# Patient Record
Sex: Male | Born: 1985 | Race: Black or African American | Hispanic: No | Marital: Single | Smoking: Never smoker
Health system: Southern US, Community
[De-identification: ages and names within clinical notes are randomized; demographics above are authoritative.]

## PROBLEM LIST (undated history)

## (undated) DIAGNOSIS — J45909 Unspecified asthma, uncomplicated: Secondary | ICD-10-CM

## (undated) DIAGNOSIS — G510 Bell's palsy: Secondary | ICD-10-CM

## (undated) DIAGNOSIS — T7840XA Allergy, unspecified, initial encounter: Secondary | ICD-10-CM

## (undated) DIAGNOSIS — E669 Obesity, unspecified: Secondary | ICD-10-CM

## (undated) DIAGNOSIS — R519 Headache, unspecified: Secondary | ICD-10-CM

## (undated) HISTORY — DX: Headache, unspecified: R51.9

## (undated) HISTORY — PX: TONSILLECTOMY: SUR1361

## (undated) HISTORY — DX: Bell's palsy: G51.0

## (undated) HISTORY — DX: Allergy, unspecified, initial encounter: T78.40XA

---
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
## 2005-11-05 HISTORY — PX: ANAL FISSURE REPAIR: SHX2312

## 2006-09-23 ENCOUNTER — Emergency Department (HOSPITAL_COMMUNITY): Admission: EM | Admit: 2006-09-23 | Discharge: 2006-09-23 | Payer: Self-pay | Admitting: Emergency Medicine

## 2006-09-26 ENCOUNTER — Emergency Department (HOSPITAL_COMMUNITY): Admission: EM | Admit: 2006-09-26 | Discharge: 2006-09-27 | Payer: Self-pay | Admitting: Emergency Medicine

## 2006-10-04 ENCOUNTER — Encounter: Admission: RE | Admit: 2006-10-04 | Discharge: 2006-10-04 | Payer: Self-pay | Admitting: General Surgery

## 2006-10-08 ENCOUNTER — Ambulatory Visit (HOSPITAL_COMMUNITY): Admission: RE | Admit: 2006-10-08 | Discharge: 2006-10-08 | Payer: Self-pay | Admitting: General Surgery

## 2006-12-28 ENCOUNTER — Emergency Department (HOSPITAL_COMMUNITY): Admission: EM | Admit: 2006-12-28 | Discharge: 2006-12-28 | Payer: Self-pay | Admitting: Emergency Medicine

## 2007-02-28 ENCOUNTER — Emergency Department (HOSPITAL_COMMUNITY): Admission: EM | Admit: 2007-02-28 | Discharge: 2007-02-28 | Payer: Self-pay | Admitting: Family Medicine

## 2007-05-04 ENCOUNTER — Emergency Department (HOSPITAL_COMMUNITY): Admission: EM | Admit: 2007-05-04 | Discharge: 2007-05-05 | Payer: Self-pay | Admitting: Emergency Medicine

## 2007-11-21 ENCOUNTER — Inpatient Hospital Stay (HOSPITAL_COMMUNITY): Admission: EM | Admit: 2007-11-21 | Discharge: 2007-11-23 | Payer: Self-pay | Admitting: Emergency Medicine

## 2007-11-26 ENCOUNTER — Ambulatory Visit: Payer: Self-pay | Admitting: Gastroenterology

## 2008-01-20 ENCOUNTER — Emergency Department (HOSPITAL_COMMUNITY): Admission: EM | Admit: 2008-01-20 | Discharge: 2008-01-20 | Payer: Self-pay | Admitting: Emergency Medicine

## 2008-11-09 ENCOUNTER — Emergency Department (HOSPITAL_COMMUNITY): Admission: EM | Admit: 2008-11-09 | Discharge: 2008-11-10 | Payer: Self-pay | Admitting: Emergency Medicine

## 2009-02-28 ENCOUNTER — Emergency Department (HOSPITAL_COMMUNITY): Admission: EM | Admit: 2009-02-28 | Discharge: 2009-02-28 | Payer: Self-pay | Admitting: Family Medicine

## 2009-05-12 ENCOUNTER — Emergency Department (HOSPITAL_COMMUNITY): Admission: EM | Admit: 2009-05-12 | Discharge: 2009-05-12 | Payer: Self-pay | Admitting: Family Medicine

## 2010-11-27 ENCOUNTER — Encounter: Payer: Self-pay | Admitting: General Surgery

## 2011-02-14 LAB — POCT URINALYSIS DIP (DEVICE)
Bilirubin Urine: NEGATIVE
Glucose, UA: NEGATIVE mg/dL
Ketones, ur: NEGATIVE mg/dL
Nitrite: NEGATIVE
pH: 5.5 (ref 5.0–8.0)

## 2011-02-14 LAB — CBC
MCHC: 33.7 g/dL (ref 30.0–36.0)
MCV: 83.6 fL (ref 78.0–100.0)
Platelets: 318 10*3/uL (ref 150–400)
RBC: 4.9 MIL/uL (ref 4.22–5.81)
WBC: 10 10*3/uL (ref 4.0–10.5)

## 2011-02-14 LAB — DIFFERENTIAL
Basophils Relative: 1 % (ref 0–1)
Eosinophils Absolute: 0.2 10*3/uL (ref 0.0–0.7)
Lymphs Abs: 4.8 10*3/uL — ABNORMAL HIGH (ref 0.7–4.0)
Monocytes Relative: 6 % (ref 3–12)
Neutro Abs: 4.3 10*3/uL (ref 1.7–7.7)
Neutrophils Relative %: 43 % (ref 43–77)

## 2011-02-14 LAB — POCT I-STAT, CHEM 8
BUN: 8 mg/dL (ref 6–23)
Chloride: 106 mEq/L (ref 96–112)
HCT: 43 % (ref 39.0–52.0)
Potassium: 4 mEq/L (ref 3.5–5.1)

## 2011-02-19 LAB — CBC
HCT: 43.5 % (ref 39.0–52.0)
Hemoglobin: 14.5 g/dL (ref 13.0–17.0)
MCV: 84 fL (ref 78.0–100.0)
Platelets: 256 10*3/uL (ref 150–400)
RBC: 5.18 MIL/uL (ref 4.22–5.81)
WBC: 9.3 10*3/uL (ref 4.0–10.5)

## 2011-02-19 LAB — DIFFERENTIAL
Eosinophils Absolute: 0.1 10*3/uL (ref 0.0–0.7)
Eosinophils Relative: 1 % (ref 0–5)
Lymphocytes Relative: 21 % (ref 12–46)
Lymphs Abs: 1.9 10*3/uL (ref 0.7–4.0)
Monocytes Absolute: 0.6 10*3/uL (ref 0.1–1.0)
Monocytes Relative: 6 % (ref 3–12)

## 2011-02-19 LAB — POCT I-STAT, CHEM 8
Glucose, Bld: 294 mg/dL — ABNORMAL HIGH (ref 70–99)
HCT: 46 % (ref 39.0–52.0)
Hemoglobin: 15.6 g/dL (ref 13.0–17.0)
Potassium: 3.7 mEq/L (ref 3.5–5.1)

## 2011-03-20 NOTE — H&P (Signed)
Bruce Henry, Bruce NO.:  [id number]   MEDICAL RECORD NO.:  [id number]          PATIENT TYPE:  INP   LOCATION:  5125                         FACILITY:  [facility]   PHYSICIAN:  Della Goo, M.D. DATE OF BIRTH:  [birth year]   DATE OF ADMISSION:  11/21/2007  DATE OF DISCHARGE:                              HISTORY & PHYSICAL   This is an unassigned patient.   CHIEF COMPLAINT:  Abdominal pain.   HISTORY OF PRESENT ILLNESS:  A 25 year old male presenting to the  emergency department with complaints of severe nausea, vomiting,  abdominal pain that started this afternoon.  He reports passing black  tarry stools and also vomiting up coffee-ground emesis.  He denies  having any previous similar episodes to this.  He also denies using any  NSAIDs or aspirin therapies..  He denies having any fevers, chills,  shortness of breath or weakness.   PAST MEDICAL HISTORY:  None.   PAST SURGICAL HISTORY:  1. History of an anal fistula incision and drainage.  2. History of left shoulder surgery after a football injury.  3. History of a tonsillectomy and adenoidectomy.   MEDICATIONS:  None.   ALLERGIES:  No known drug allergies.   SOCIAL HISTORY:  Nonsmoker, nondrinker.   FAMILY HISTORY:  Noncontributory.   PHYSICAL EXAMINATION FINDINGS:  This is a morbidly obese 25 year old  male in discomfort but in no acute distress.  His vital signs are  stable.  He is afebrile.  HEENT: Normocephalic, atraumatic.  Pupils equally round and reactive to  light.  Extraocular muscles are intact, funduscopic benign, oropharynx  is clear.  NECK:  Supple, full range of motion.  No thyromegaly, adenopathy,  jugular venous distention.  CARDIOVASCULAR:  Regular rate and rhythm.  No murmurs, gallops or rubs.  LUNGS:  Clear to auscultation bilaterally.  ABDOMEN:  Positive bowel sounds, soft, nontender, nondistended.  EXTREMITIES:  Without cyanosis, clubbing or edema.  NEUROLOGIC EXAMINATION:   Alert and oriented x3.  There are no focal  deficits.   LABORATORY STUDIES:  Hemoglobin level found to be 13.6 on admission.   ASSESSMENT:  A 25 year old male being admitted with:  1. Acute gastrointestinal bleeding.  2. Epigastric abdominal pain.  3. Morbid obesity.   PLAN:  The patient will be admitted and monitored for continued blood  loss.  H&H checks will be performed q.8 h x48 hours.  The patient will  be transfused if needed.  The patient will be placed on IV Protonix  q.12h and a gastroenterology consultation will be placed.  DVT  prophylaxis of TED hose has been ordered.      Della Goo, M.D.  Electronically Signed     HJ/MEDQ  D:  11/22/2007  T:  11/22/2007  Job:  [id number]

## 2011-03-20 NOTE — Discharge Summary (Signed)
Bruce Henry, Bruce Henry NO.:  [id number]   MEDICAL RECORD NO.:  [id number]          PATIENT TYPE:  INP   LOCATION:  5159                         FACILITY:  [facility]   PHYSICIAN:  Marcellus Scott, MD     DATE OF BIRTH:  1986-07-30   DATE OF ADMISSION:  11/21/2007  DATE OF DISCHARGE:  11/23/2007                               DISCHARGE SUMMARY   PRIMARY CARE PHYSICIAN:  Unassigned.   DISCHARGE DIAGNOSES:  1. Upper gastrointestinal bleed, undetermined source.  2. Mild acute blood loss anemia.  3. Morbid obesity.  4. Abdominal pain.   DISCHARGE MEDICATIONS:  1. Protonix 40 mg p.o. daily.   PROCEDURE:  1. EGD by Dr. Arlyce Dice. Impression is normal exam.   LABORATORY DATA:  Hemoglobin and hematocrit 12.1 and 36.8. Basic  metabolic panel normal with BUN 6, creatinine 0.8. Fecal occult blood  testing positive. Hemoglobin on admission was 13.2. White blood cells  11.5.   CONSULTATIONS:  Melvia Heaps, M.D., GI.   HOSPITAL COURSE/DISPOSITION:  For details of initial admission, please  refer to the history and physical note. In summary, Bruce Henry is a  pleasant 25 year old African-American male patient with no past medical  history and no history of significant alcohol intake or NSAIDS, who  presented with upper abdominal pain, nausea, and vomiting of coffee  ground emesis and melena. Further evaluation in the emergency room found  the patient to be hemodynamically stable and a hemoglobin of 13.6. He  was admitted to the medical floor for further evaluation and management.  The patient was placed on clear liquid diet, IV fluids, and IV Protonix,  and Phenergan. Gastroenterology was consulted and proceeded to do EGD  with findings as indicated above. The patient also indicates that he  works with the [employer] and [employer] as a part  of his daily activity. He has complained of some intermittent upper  abdominal pain for months. In any event, the GI  bleed will not explain  his abdominal pain. The patient's diet was advanced, which he has  tolerated. His abdominal pain has resolved now and so has his GI bleed.  The patient has passed normal colored stools. Gastroenterology has  followed him and indicates that he could have Dieulafoy's ulcer,however,  at this time the patient is stable to be discharged and with no further  GI workup unless he has frank bleeding in which case, the patient has  been instructed to seek immediate medical  attention. The patient also has been advised to diet, exercise, and lose  weight. He will have to have workup with fasting lipids as an  outpatient. He indicates that he does not have a primary medical doctor  and wants assistance in finding one. We will provide him with a  physician's contact.      Marcellus Scott, MD  Electronically Signed     AH/MEDQ  D:  11/23/2007  T:  11/23/2007  Job:  [id number]   cc:   Della Goo, M.D.  Barbette Hair. Arlyce Dice, MD,FACG
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
## 2011-07-26 LAB — CBC
Hemoglobin: 13.2
Platelets: 341
RDW: 13.9

## 2011-07-26 LAB — DIFFERENTIAL
Basophils Absolute: 0.1
Lymphocytes Relative: 48 — ABNORMAL HIGH
Monocytes Absolute: 0.9
Neutro Abs: 4.8

## 2011-07-26 LAB — BASIC METABOLIC PANEL
CO2: 26
GFR calc non Af Amer: >60
Glucose, Bld: 88
Potassium: 3.8
Sodium: 139

## 2011-07-26 LAB — I-STAT 8, (EC8 V) (CONVERTED LAB)
Bicarbonate: 24.2 — ABNORMAL HIGH
HCT: 45
TCO2: 25
pCO2, Ven: 37.1 — ABNORMAL LOW
pH, Ven: 7.421 — ABNORMAL HIGH

## 2011-07-26 LAB — TYPE AND SCREEN
ABO/RH(D): O POS
Antibody Screen: NEGATIVE

## 2011-07-26 LAB — ABO/RH: ABO/RH(D): O POS

## 2011-07-26 LAB — HEMOGLOBIN AND HEMATOCRIT, BLOOD: HCT: 36.8 — ABNORMAL LOW

## 2011-07-30 LAB — COMPREHENSIVE METABOLIC PANEL
AST: 46 — ABNORMAL HIGH
Alkaline Phosphatase: 57
CO2: 25
Chloride: 102
Creatinine, Ser: 0.77
GFR calc Af Amer: >60
GFR calc non Af Amer: >60
Potassium: 3.6
Total Bilirubin: 0.4

## 2011-07-30 LAB — DIFFERENTIAL
Eosinophils Relative: 3
Lymphocytes Relative: 46
Lymphs Abs: 5.9 — ABNORMAL HIGH

## 2011-07-30 LAB — CBC
HCT: 40.4
MCV: 83.3
RBC: 4.86
WBC: 12.9 — ABNORMAL HIGH

## 2011-07-30 LAB — LIPASE, BLOOD: Lipase: 20

## 2012-11-05 DIAGNOSIS — G510 Bell's palsy: Secondary | ICD-10-CM

## 2012-11-05 HISTORY — DX: Bell's palsy: G51.0

## 2013-02-19 ENCOUNTER — Encounter (HOSPITAL_COMMUNITY): Payer: Self-pay | Admitting: Emergency Medicine

## 2013-02-19 ENCOUNTER — Emergency Department (HOSPITAL_COMMUNITY): Payer: No Typology Code available for payment source

## 2013-02-19 ENCOUNTER — Emergency Department (HOSPITAL_COMMUNITY)
Admission: EM | Admit: 2013-02-19 | Discharge: 2013-02-20 | Disposition: A | Discharge status: Home / Self Care | Payer: No Typology Code available for payment source | Attending: Emergency Medicine | Admitting: Emergency Medicine

## 2013-02-19 DIAGNOSIS — S0180XA Unspecified open wound of other part of head, initial encounter: Secondary | ICD-10-CM | POA: Insufficient documentation

## 2013-02-19 DIAGNOSIS — J45909 Unspecified asthma, uncomplicated: Secondary | ICD-10-CM | POA: Insufficient documentation

## 2013-02-19 DIAGNOSIS — S0990XA Unspecified injury of head, initial encounter: Secondary | ICD-10-CM | POA: Insufficient documentation

## 2013-02-19 DIAGNOSIS — Y9289 Other specified places as the place of occurrence of the external cause: Secondary | ICD-10-CM | POA: Insufficient documentation

## 2013-02-19 DIAGNOSIS — IMO0002 Reserved for concepts with insufficient information to code with codable children: Secondary | ICD-10-CM

## 2013-02-19 DIAGNOSIS — Y9389 Activity, other specified: Secondary | ICD-10-CM | POA: Insufficient documentation

## 2013-02-19 DIAGNOSIS — E669 Obesity, unspecified: Secondary | ICD-10-CM | POA: Insufficient documentation

## 2013-02-19 DIAGNOSIS — Z23 Encounter for immunization: Secondary | ICD-10-CM | POA: Insufficient documentation

## 2013-02-19 HISTORY — DX: Unspecified asthma, uncomplicated: J45.909

## 2013-02-19 HISTORY — DX: Obesity, unspecified: E66.9

## 2013-02-19 NOTE — ED Notes (Signed)
CMS intact distal to L leg injury.
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
## 2013-02-20 ENCOUNTER — Emergency Department (HOSPITAL_COMMUNITY): Payer: No Typology Code available for payment source

## 2013-02-20 MED ORDER — HYDROCODONE-ACETAMINOPHEN 5-325 MG PO TABS
2.0000 | ORAL_TABLET | Freq: Once | ORAL | Status: AC
  Administered 2013-02-20: 2 via ORAL
  Filled 2013-02-20: qty 2

## 2013-02-20 MED ORDER — TETANUS-DIPHTH-ACELL PERTUSSIS 5-2.5-18.5 LF-MCG/0.5 IM SUSP
0.5000 mL | Freq: Once | INTRAMUSCULAR | Status: AC
  Administered 2013-02-20: 0.5 mL via INTRAMUSCULAR
  Filled 2013-02-20: qty 0.5

## 2013-02-20 MED ORDER — IBUPROFEN 600 MG PO TABS: 600.0000 mg | ORAL_TABLET | Freq: Four times a day (QID) | ORAL | Status: DC | PRN
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
## 2013-02-20 NOTE — ED Notes (Signed)
EDP Nanavati at bedside suturing wound.

## 2013-04-29 ENCOUNTER — Emergency Department (HOSPITAL_COMMUNITY)
Admission: EM | Admit: 2013-04-29 | Discharge: 2013-04-29 | Disposition: A | Discharge status: Home / Self Care | Payer: Managed Care, Other (non HMO) | Attending: Emergency Medicine | Admitting: Emergency Medicine

## 2013-04-29 ENCOUNTER — Encounter (HOSPITAL_COMMUNITY): Payer: Self-pay | Admitting: Emergency Medicine

## 2013-04-29 DIAGNOSIS — N23 Unspecified renal colic: Secondary | ICD-10-CM | POA: Insufficient documentation

## 2013-04-29 DIAGNOSIS — H9202 Otalgia, left ear: Secondary | ICD-10-CM

## 2013-04-29 DIAGNOSIS — R5383 Other fatigue: Secondary | ICD-10-CM | POA: Insufficient documentation

## 2013-04-29 DIAGNOSIS — R209 Unspecified disturbances of skin sensation: Secondary | ICD-10-CM | POA: Insufficient documentation

## 2013-04-29 DIAGNOSIS — J45909 Unspecified asthma, uncomplicated: Secondary | ICD-10-CM | POA: Insufficient documentation

## 2013-04-29 DIAGNOSIS — R29810 Facial weakness: Secondary | ICD-10-CM | POA: Insufficient documentation

## 2013-04-29 DIAGNOSIS — R5381 Other malaise: Secondary | ICD-10-CM | POA: Insufficient documentation

## 2013-04-29 DIAGNOSIS — E669 Obesity, unspecified: Secondary | ICD-10-CM | POA: Insufficient documentation

## 2013-04-29 DIAGNOSIS — G510 Bell's palsy: Secondary | ICD-10-CM | POA: Insufficient documentation

## 2013-04-29 LAB — CBC WITH DIFFERENTIAL/PLATELET
HCT: 39.7 % (ref 39.0–52.0)
Hemoglobin: 13.8 g/dL (ref 13.0–17.0)
Lymphocytes Relative: 54 % — ABNORMAL HIGH (ref 12–46)
MCHC: 34.8 g/dL (ref 30.0–36.0)
Monocytes Absolute: 0.7 10*3/uL (ref 0.1–1.0)
Monocytes Relative: 7 % (ref 3–12)
Neutro Abs: 3.8 10*3/uL (ref 1.7–7.7)
WBC: 10.1 10*3/uL (ref 4.0–10.5)

## 2013-04-29 LAB — BASIC METABOLIC PANEL
BUN: 7 mg/dL (ref 6–23)
CO2: 26 mEq/L (ref 19–32)
Chloride: 98 mEq/L (ref 96–112)
Creatinine, Ser: 0.69 mg/dL (ref 0.50–1.35)

## 2013-04-29 MED ORDER — PREDNISONE 20 MG PO TABS: ORAL_TABLET | ORAL | Status: DC

## 2013-04-29 MED ORDER — ACYCLOVIR 400 MG PO TABS: 400.0000 mg | ORAL_TABLET | Freq: Every day | ORAL | Status: DC

## 2013-04-29 NOTE — ED Notes (Signed)
Pt c/o left ear pain x 5 days with facial droop and eye droop on left side upon waking this am; no other neuro deficits noted

## 2013-04-29 NOTE — ED Provider Notes (Signed)
History    CSN: [id number] Arrival date & time 04/29/13  1311  First MD Initiated Contact with Patient 04/29/13 1627     Chief Complaint  Patient presents with  . Otalgia  . Facial Droop   (Consider location/radiation/quality/duration/timing/severity/associated sxs/prior Treatment) HPI Comments: Patient comes to the ER for evaluation of left ear pain and left facial numbness. Patient reports that he started having pain in the left ureter proximally 5 days ago. No drainage from the ear. No hearing loss. Today he noticed that the left side of his face has been known in slightly drooping. He also has noticed tearing from the left eye he cannot open and close his eye on the left side as well as on the right. There is no headache. Patient denies numbness, tingling, weakness in any extremity.  Patient is a 27 y.o. male presenting with ear pain.  Otalgia Associated symptoms: no hearing loss    Past Medical History  Diagnosis Date  . Obesity   . Asthma    Past Surgical History  Procedure Laterality Date  . Tonsillectomy     History reviewed. No pertinent family history. History  Substance Use Topics  . Smoking status: Never Smoker   . Smokeless tobacco: Not on file  . Alcohol Use: Yes    Review of Systems  HENT: Positive for ear pain. Negative for hearing loss.   Neurological: Positive for weakness and numbness.  All other systems reviewed and are negative.    Allergies  Review of patient's allergies indicates no known allergies.  Home Medications  No current outpatient prescriptions on file. BP 133/77  Pulse 77  Temp(Src) 98.2 F (36.8 C) (Oral)  Resp 16  SpO2 99% Physical Exam  Constitutional: He is oriented to person, place, and time. He appears well-developed and well-nourished. No distress.  HENT:  Head: Normocephalic and atraumatic.  Right Ear: Hearing, tympanic membrane, external ear and ear canal normal.  Left Ear: Hearing, tympanic membrane, external ear  and ear canal normal.  Nose: Nose normal.  Mouth/Throat: Oropharynx is clear and moist and mucous membranes are normal.  Eyes: Conjunctivae and EOM are normal. Pupils are equal, round, and reactive to light.  Neck: Normal range of motion. Neck supple.  Cardiovascular: Regular rhythm, S1 normal and S2 normal.  Exam reveals no gallop and no friction rub.   No murmur heard. Pulmonary/Chest: Effort normal and breath sounds normal. No respiratory distress. He exhibits no tenderness.  Abdominal: Soft. Normal appearance and bowel sounds are normal. There is no hepatosplenomegaly. There is no tenderness. There is no rebound, no guarding, no tenderness at McBurney's point and negative Murphy's sign. No hernia.  Musculoskeletal: Normal range of motion.  Neurological: He is alert and oriented to person, place, and time. He has normal strength. A cranial nerve deficit is present. No sensory deficit. Coordination normal. GCS eye subscore is 4. GCS verbal subscore is 5. GCS motor subscore is 6.  Left facial nerve sensory and motor deficit - includes forehead and left eyelids  Skin: Skin is warm, dry and intact. No rash noted. No cyanosis.  Psychiatric: He has a normal mood and affect. His speech is normal and behavior is normal. Thought content normal.    ED Course  Procedures (including critical care time) Labs Reviewed  CBC WITH DIFFERENTIAL - Abnormal; Notable for the following:    Neutrophils Relative % 38 (*)    Lymphocytes Relative 54 (*)    Lymphs Abs 5.5 (*)    All  other components within normal limits  BASIC METABOLIC PANEL - Abnormal; Notable for the following:    Glucose, Bld 240 (*)    All other components within normal limits   No results found. Diagnosis: 1. Left Bell's palsy 2. Left otalgia  MDM  Patient presents to ER with 5 days of left ear pain. Examination of the left external canal and tympanic membrane is normal.  Patient also complains of left facial droop. Examination  reveals left facial nerve deficit consistent with Bell's palsy. Upper motor neuron is involved, patient is having difficulty closing his eye causing the tearing. He does not warrant any imaging. He'll be treated empirically. He did have left ear pain, but there are no vesicles or other abnormalities in the left external canal or left tympanic membrane. Will empirically prescribe acyclovir. Patient denies any recent tick bites or rash elsewhere.  Gilda Crease, MD 04/29/13 [phone]

## 2013-05-05 ENCOUNTER — Telehealth: Payer: Self-pay | Admitting: Neurology

## 2013-05-22 ENCOUNTER — Encounter: Payer: Self-pay | Admitting: Neurology

## 2013-05-22 ENCOUNTER — Ambulatory Visit: Payer: Managed Care, Other (non HMO) | Admitting: Neurology

## 2013-05-22 ENCOUNTER — Ambulatory Visit (INDEPENDENT_AMBULATORY_CARE_PROVIDER_SITE_OTHER): Payer: Managed Care, Other (non HMO) | Admitting: Neurology

## 2013-05-22 VITALS — BP 110/70 | HR 78 | Temp 98.1°F | Ht 65.5 in | Wt 307.0 lb

## 2013-05-22 DIAGNOSIS — G510 Bell's palsy: Secondary | ICD-10-CM

## 2013-05-22 NOTE — Patient Instructions (Signed)
You likely have Bell's Palsy (inflammation of the facial nerve).  It will simply take time to further heal.  In the meantime, consider using eye drops like lacrilube in your eye and cover with a eye patch at night to prevent scratching of your cornea.  If you experience eye pain, see an eye doctor.

## 2013-05-22 NOTE — Progress Notes (Signed)
NEUROLOGY CONSULTATION NOTE  Bruce Henry MRN: [id number] DOB: [birth year]   Referring provider: Emergency Department Primary care provider: None  Reason for consult:  Bell's Palsy  HISTORY OF PRESENT ILLNESS: Bruce Henry is a 27 y.o. male who presents for the evaluation of ear pain and left-sided facial weakness. He is accompanied by his wife. On 04/29/13, patient presented to the emergency department with left ear pain and left facial weakness. Approximately 3 weeks prior, he developed severe ear pain in and in back of his ear. The day prior to his ER visit, he noticed that his left eye was droopy and that his mouth was crooked. He did not have any associated headache. He did not have any numbness or hearing loss or tinnitus. He denies any dysfunction of taste. He denies any visual dysfunction. He does note tearing of the left eye. He was treated empirically with acyclovir and prednisone. He continues to have your pain which is intermittent. He describes hyperacusis in the left ear. He does use eye drops but doesn't wear a patch over his eye when he sleeps. He notes perhaps mild improvement in his left-sided facial weakness.  PAST MEDICAL HISTORY: Past Medical History  Diagnosis Date  . Obesity   . Asthma   . Bell's palsy 2014    PAST SURGICAL HISTORY: Past Surgical History  Procedure Laterality Date  . Tonsillectomy    . Shoulder surgery  2003    left  . Anal fissure repair  2007    MEDICATIONS: Current Outpatient Prescriptions on File Prior to Visit  Medication Sig Dispense Refill  . acyclovir (ZOVIRAX) 400 MG tablet Take 1 tablet (400 mg total) by mouth 5 (five) times daily.  50 tablet  0  . predniSONE (DELTASONE) 20 MG tablet 3 tabs po daily for 5 days, then 2.5 tabs for one day, then 2 tabs for one day, then 1.5 tabs for one day, then 1 tab for one day, then 0.5 tab for one day then stop.  23 tablet  0   No current facility-administered medications on file prior to  visit.    ALLERGIES: No Known Allergies  FAMILY HISTORY: No family history on file.  SOCIAL HISTORY: History   Social History  . Marital Status: Single    Spouse Name: N/A    Number of Children: N/A  . Years of Education: N/A   Occupational History  . Not on file.   Social History Main Topics  . Smoking status: Never Smoker   . Smokeless tobacco: Former Neurosurgeon  . Alcohol Use: Yes     Comment: occasionally  . Drug Use: No  . Sexually Active: Not on file   Other Topics Concern  . Not on file   Social History Narrative  . No narrative on file    REVIEW OF SYSTEMS: Constitutional: No fevers, chills, or sweats, no generalized fatigue, change in appetite Eyes: No visual changes, double vision, eye pain Ear, nose and throat: No hearing loss, ear pain, nasal congestion, sore throat Cardiovascular: No chest pain, palpitations Respiratory:  No shortness of breath at rest or with exertion, wheezes GastrointestinaI: No nausea, vomiting, diarrhea, abdominal pain, fecal incontinence Genitourinary:  No dysuria, urinary retention or frequency Musculoskeletal:  No neck pain, back pain Integumentary: No rash, pruritus, skin lesions Neurological: as above Psychiatric: No depression, insomnia, anxiety Endocrine: No palpitations, fatigue, diaphoresis, mood swings, change in appetite, change in weight, increased thirst Hematologic/Lymphatic:  No anemia, purpura, petechiae. Allergic/Immunologic: no itchy/runny eyes, nasal  congestion, recent allergic reactions, rashes  PHYSICAL EXAM: Filed Vitals:   05/22/13 1537  BP: 110/70  Pulse: 78  Temp: 98.1 F (36.7 C)   General: No acute distress Head:  Normocephalic/atraumatic Neck: supple, no paraspinal tenderness, full range of motion Back: No paraspinal tenderness Heart: regular rate and rhythm Lungs: Clear to auscultation bilaterally. Vascular: No carotid bruits. Neurological Exam: Mental status: alert and oriented to person,  place, time and self, speech fluent and not dysarthric, language intact. Cranial nerves: CN I: not tested CN II: pupils equal, round and reactive to light, visual fields intact, fundi unremarkable. CN III, IV, VI:  full range of motion, no nystagmus, no ptosis CN V: facial sensation intact CN VII: mild upper and lower facial weakness on left CN VIII: hearing intact CN IX, X: gag intact, uvula midline CN XI: sternocleidomastoid and trapezius muscles intact CN XII: tongue midline Bulk & Tone: normal, no fasciculations. Motor:  Sensation: temperature and vibration intact Deep Tendon Reflexes: 2+ throughout, toes down-going Finger to nose testing: normal Gait: normal. Romberg negative.  IMPRESSION & PLAN: Bruce Henry is a 27 y.o. male with Bell's palsy affecting the left facial nerve.  Symptoms seem fairly mild.  Explained that it will simply take time for further resolution of symptoms.  Usually it can take up to 3 months before complete or near-complete resolution of symptoms.  In the meantime, he may consider using Lacri-Lube and eye patch when he sleeps to prevent corneal abrasions.  He should have his eye checked if he develops any burning or pain in his eye.  No further workup warranted.  45 minutes but with the patient, over 50% spent counseling and coordinating care.  Thank you for allowing me to take part in the care of this patient.  Shon Millet, DO

## 2013-09-10 ENCOUNTER — Other Ambulatory Visit: Payer: Self-pay

## 2014-04-20 ENCOUNTER — Encounter (HOSPITAL_COMMUNITY): Payer: Self-pay | Admitting: Emergency Medicine

## 2014-04-20 ENCOUNTER — Emergency Department (INDEPENDENT_AMBULATORY_CARE_PROVIDER_SITE_OTHER)
Admission: EM | Admit: 2014-04-20 | Discharge: 2014-04-20 | Disposition: A | Discharge status: Home / Self Care | Payer: Managed Care, Other (non HMO) | Source: Home / Self Care | Attending: Emergency Medicine | Admitting: Emergency Medicine

## 2014-04-20 DIAGNOSIS — I889 Nonspecific lymphadenitis, unspecified: Secondary | ICD-10-CM

## 2014-04-20 DIAGNOSIS — H6690 Otitis media, unspecified, unspecified ear: Secondary | ICD-10-CM

## 2014-04-20 LAB — POCT RAPID STREP A: STREPTOCOCCUS, GROUP A SCREEN (DIRECT): NEGATIVE

## 2014-04-20 MED ORDER — AMOXICILLIN 500 MG PO CAPS: 500.0000 mg | ORAL_CAPSULE | Freq: Three times a day (TID) | ORAL | Status: DC

## 2014-04-20 NOTE — Discharge Instructions (Signed)
Otitis Media, Adult Otitis media is redness, soreness, and puffiness (swelling) in the space just behind your eardrum (middle ear). It may be caused by allergies or infection. It often happens along with a cold. HOME CARE  Take your medicine as told. Finish it even if you start to feel better.  Only take over-the-counter or prescription medicines for pain, discomfort, or fever as told by your doctor.  Follow up with your doctor as told. GET HELP IF:  You have otitis media only in one ear or bleeding from your nose or both.  You notice a lump on your neck.  You are not getting better in 3 5 days.  You feel worse instead of better. GET HELP RIGHT AWAY IF:   You have pain that is not helped with medicine.  You have puffiness, redness, or pain around your ear.  You get a stiff neck.  You cannot move part of your face (paralysis).  You notice that the bone behind your ear hurts when you touch it. MAKE SURE YOU:   Understand these instructions.  Will watch your condition.  Will get help right away if you are not doing well or get worse. Document Released: 04/09/2008 Document Revised: 06/24/2013 Document Reviewed: 05/19/2013 [facility] Patient Information 2014 [place], [place].  Swollen Lymph Nodes The lymphatic system filters fluid from around cells. It is like a system of blood vessels. These channels carry lymph instead of blood. The lymphatic system is an important part of the immune (disease fighting) system. When people talk about "swollen glands in the neck," they are usually talking about swollen lymph nodes. The lymph nodes are like the little traps for infection. You and your caregiver may be able to feel lymph nodes, especially swollen nodes, in these common areas: the groin (inguinal area), armpits (axilla), and above the clavicle (supraclavicular). You may also feel them in the neck (cervical) and the back of the head just above the hairline (occipital). Swollen glands  occur when there is any condition in which the body responds with an allergic type of reaction. For instance, the glands in the neck can become swollen from insect bites or any type of minor infection on the head. These are very noticeable in children with only minor problems. Lymph nodes may also become swollen when there is a tumor or problem with the lymphatic system, such as Hodgkin's disease. TREATMENT   Most swollen glands do not require treatment. They can be observed (watched) for a short period of time, if your caregiver feels it is necessary. Most of the time, observation is not necessary.  Antibiotics (medicines that kill germs) may be prescribed by your caregiver. Your caregiver may prescribe these if he or she feels the swollen glands are due to a bacterial (germ) infection. Antibiotics are not used if the swollen glands are caused by a virus. HOME CARE INSTRUCTIONS   Take medications as directed by your caregiver. Only take over-the-counter or prescription medicines for pain, discomfort, or fever as directed by your caregiver. SEEK MEDICAL CARE IF:   If you begin to run a temperature greater than 102 F (38.9 C), or as your caregiver suggests. MAKE SURE YOU:   Understand these instructions.  Will watch your condition.  Will get help right away if you are not doing well or get worse. Document Released: 10/12/2002 Document Revised: 01/14/2012 Document Reviewed: 10/22/2005 [facility] Patient Information 2014 [place], [place].   Take Motrin 800mg  every 8 hours until neck and nodes feel better in the  neck. Use Mucinex 1200mg  every 12 hours for ear pain and drainage. Finish all antibiotics. Feel better.

## 2014-04-20 NOTE — ED Notes (Signed)
Pt c/o right side of neck pain onset 4 days Reports he woke up w/the pain Denies f/v/n/d, cold sx, inj/trauma Also c/o bilateral ear pain onset 2 days Alert w/no signs of acute distress.

## 2014-04-20 NOTE — ED Provider Notes (Signed)
CSN: [id number]     Arrival date & time 04/20/14  1849 History   First MD Initiated Contact with Patient 04/20/14 1916     Chief Complaint  Patient presents with  . Neck Pain   (Consider location/radiation/quality/duration/timing/severity/associated sxs/prior Treatment) HPI Comments: Patient presents with a 4 day history of right sided neck pain and "swelling", severe right ear pain as well. No injury. No fevers. No muscle pain. Sore throat at times on the right side. See ROS.   Patient is a 28 y.o. male presenting with neck pain. The history is provided by the patient.  Neck Pain   Past Medical History  Diagnosis Date  . Obesity   . Asthma   . Bell's palsy 2014   Past Surgical History  Procedure Laterality Date  . Tonsillectomy    . Shoulder surgery  2003    left  . Anal fissure repair  2007   No family history on file. History  Substance Use Topics  . Smoking status: Never Smoker   . Smokeless tobacco: Former NeurosurgeonUser  . Alcohol Use: Yes     Comment: occasionally    Review of Systems  Musculoskeletal: Positive for neck pain.  All other systems reviewed and are negative.   Allergies  Review of patient's allergies indicates no known allergies.  Home Medications   Prior to Admission medications   Medication Sig Start Date End Date Taking? Authorizing Provider  aspirin 325 MG tablet Take by mouth every 6 (six) hours as needed for pain.   Yes Historical Provider, MD  acyclovir (ZOVIRAX) 400 MG tablet Take 1 tablet (400 mg total) by mouth 5 (five) times daily. 04/29/13   Gilda Creasehristopher J. Pollina, MD  amoxicillin (AMOXIL) 500 MG capsule Take 1 capsule (500 mg total) by mouth 3 (three) times daily. 04/20/14   Riki SheerMichelle G Young, PA-C  predniSONE (DELTASONE) 20 MG tablet 3 tabs po daily for 5 days, then 2.5 tabs for one day, then 2 tabs for one day, then 1.5 tabs for one day, then 1 tab for one day, then 0.5 tab for one day then stop. 04/29/13   Gilda Creasehristopher J. Pollina, MD   BP  126/82  Pulse 104  Temp(Src) 99.6 F (37.6 C) (Oral)  Resp 18  SpO2 98% Physical Exam  Nursing note and vitals reviewed. Constitutional: He is oriented to person, place, and time. He appears well-developed and well-nourished. No distress.  HENT:  Head: Normocephalic and atraumatic.  Left Ear: External ear normal.  Right TM buldge, erythematous, with retro fluid TM  Eyes: Pupils are equal, round, and reactive to light.  Neck: Normal range of motion. Neck supple.  Right tonsillar nodes, tender to palpation  Cardiovascular: Normal rate, regular rhythm and intact distal pulses.   Pulmonary/Chest: Effort normal and breath sounds normal. No respiratory distress. He has no wheezes.  Lymphadenopathy:    He has cervical adenopathy.  Neurological: He is alert and oriented to person, place, and time.  Skin: Skin is warm and dry. No rash noted. He is not diaphoretic.  Psychiatric: His behavior is normal.    ED Course  Procedures (including critical care time) Labs Review Labs Reviewed  POCT RAPID STREP A ([facility] CARE ONLY)    Imaging Review No results found.   MDM   1. Otitis media   2. Lymphadenitis    Cover with Amoxicillin. Symptomatic care for the pain with NSAIDs and Mucinex as needed. F/U if worsens.     Riki SheerMichelle G Young, PA-C  04/20/14 1956 

## 2014-04-21 NOTE — ED Provider Notes (Signed)
Medical screening examination/treatment/procedure(s) were performed by non-physician practitioner and as supervising physician I was immediately available for consultation/collaboration.  Leslee Homeavid Cartier Washko, M.D.  Reuben Likesavid C Damita Eppard, MD 04/21/14 [phone]

## 2014-04-23 LAB — CULTURE, GROUP A STREP

## 2014-04-23 NOTE — Progress Notes (Signed)
Quick Note:  Results are abnormal as noted, but have been adequately treated. No further action necessary. ______ 

## 2014-04-24 ENCOUNTER — Telehealth (HOSPITAL_COMMUNITY): Payer: Self-pay | Admitting: *Deleted

## 2014-04-24 NOTE — ED Notes (Signed)
Throat culture: Group A strep (S. Pyogenes).  Pt. adequately treated with Amoxicillin.  Needs notified. Vassie MoselleYork, Suzanne M 04/24/2014

## 2014-04-24 NOTE — ED Notes (Signed)
I called pt. Pt. verified x 2 and given results.  Pt. told he was adequately treated with Amoxicillin and instructions given.  Pt. voiced understanding. Vassie MoselleYork, Kalim Kissel M 04/24/2014

## 2014-08-20 ENCOUNTER — Other Ambulatory Visit: Payer: Self-pay

## 2014-12-14 ENCOUNTER — Encounter: Payer: Self-pay | Admitting: *Deleted

## 2015-01-03 ENCOUNTER — Ambulatory Visit (INDEPENDENT_AMBULATORY_CARE_PROVIDER_SITE_OTHER): Payer: 59 | Admitting: Family Medicine

## 2015-01-03 ENCOUNTER — Encounter: Payer: Self-pay | Admitting: Family Medicine

## 2015-01-03 VITALS — BP 126/80 | HR 84 | Temp 99.0°F | Resp 18 | Ht 65.0 in | Wt 292.0 lb

## 2015-01-03 DIAGNOSIS — Z0000 Encounter for general adult medical examination without abnormal findings: Secondary | ICD-10-CM

## 2015-01-03 DIAGNOSIS — R0981 Nasal congestion: Secondary | ICD-10-CM

## 2015-01-03 DIAGNOSIS — H6692 Otitis media, unspecified, left ear: Secondary | ICD-10-CM

## 2015-01-03 DIAGNOSIS — R7301 Impaired fasting glucose: Secondary | ICD-10-CM

## 2015-01-03 LAB — CBC WITH DIFFERENTIAL/PLATELET
BASOS ABS: 0 10*3/uL (ref 0.0–0.1)
BASOS PCT: 0 % (ref 0–1)
EOS ABS: 0.1 10*3/uL (ref 0.0–0.7)
EOS PCT: 1 % (ref 0–5)
HCT: 43.6 % (ref 39.0–52.0)
Hemoglobin: 14.4 g/dL (ref 13.0–17.0)
Lymphocytes Relative: 58 % — ABNORMAL HIGH (ref 12–46)
Lymphs Abs: 6 10*3/uL — ABNORMAL HIGH (ref 0.7–4.0)
MCH: 28.3 pg (ref 26.0–34.0)
MCHC: 33 g/dL (ref 30.0–36.0)
MCV: 85.8 fL (ref 78.0–100.0)
MONO ABS: 0.5 10*3/uL (ref 0.1–1.0)
MONOS PCT: 5 % (ref 3–12)
MPV: 10.2 fL (ref 8.6–12.4)
NEUTROS ABS: 3.7 10*3/uL (ref 1.7–7.7)
Neutrophils Relative %: 36 % — ABNORMAL LOW (ref 43–77)
PLATELETS: 323 10*3/uL (ref 150–400)
RBC: 5.08 MIL/uL (ref 4.22–5.81)
RDW: 13.6 % (ref 11.5–15.5)
WBC: 10.4 10*3/uL (ref 4.0–10.5)

## 2015-01-03 LAB — LIPID PANEL
CHOL/HDL RATIO: 13.3 ratio
Cholesterol: 305 mg/dL — ABNORMAL HIGH (ref 0–200)
HDL: 23 mg/dL — AB (ref >=40)
Triglycerides: 860 mg/dL — ABNORMAL HIGH (ref <150)

## 2015-01-03 LAB — COMPREHENSIVE METABOLIC PANEL
ALT: 51 U/L (ref 0–53)
AST: 31 U/L (ref 0–37)
Albumin: 4 g/dL (ref 3.5–5.2)
Alkaline Phosphatase: 75 U/L (ref 39–117)
BILIRUBIN TOTAL: 0.3 mg/dL (ref 0.2–1.2)
BUN: 10 mg/dL (ref 6–23)
CALCIUM: 9.6 mg/dL (ref 8.4–10.5)
CHLORIDE: 96 meq/L (ref 96–112)
CO2: 21 meq/L (ref 19–32)
CREATININE: 0.81 mg/dL (ref 0.50–1.35)
Glucose, Bld: 308 mg/dL — ABNORMAL HIGH (ref 70–99)
Potassium: 4.2 mEq/L (ref 3.5–5.3)
Sodium: 134 mEq/L — ABNORMAL LOW (ref 135–145)
Total Protein: 7.5 g/dL (ref 6.0–8.3)

## 2015-01-03 LAB — HEMOGLOBIN A1C
Hgb A1c MFr Bld: 12.2 % — ABNORMAL HIGH (ref <5.7)
Mean Plasma Glucose: 303 mg/dL — ABNORMAL HIGH (ref <117)

## 2015-01-03 MED ORDER — CETIRIZINE HCL 10 MG PO TABS: 10.0000 mg | ORAL_TABLET | Freq: Every day | ORAL | Status: DC

## 2015-01-03 MED ORDER — AMOXICILLIN 875 MG PO TABS: 875.0000 mg | ORAL_TABLET | Freq: Two times a day (BID) | ORAL | Status: DC

## 2015-01-03 NOTE — Progress Notes (Signed)
Patient ID: Bruce CoilKevin A Stelmach, male   DOB: 04-05-1986, 29 y.o.   MRN: [id number]   Subjective:    Patient ID: Bruce Henry, male    DOB: 04-05-1986, 29 y.o.   MRN: [id number]  Patient presents for New Patient and Illness  Patient here to establish care. He does not have a primary care provider. 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 He has a strong family history of diabetes mellitus as well as hypertension. He states that he passed all of the other elements on the DOT exam. His medications and history were reviewed. He does have history of childhood asthma that has resolved but he consistently has seasonal allergies. He had a severe ear infection last year which led to a Bell's palsy that has now resolved.  He complains of left ear pain over the past week which has worsened he's also had some nasal congestion and drainage for the past 2 days. He has not had any fever. No over-the-counter medications have been taken   Obesity-he states that his heaviest weight was 320 pounds he is intentionally lost about 30 pounds over the past 4 months to improve his health  Review Of Systems: per above   GEN- denies fatigue, fever, weight loss,weakness, recent illness HEENT- denies eye drainage, change in vision, +nasal discharge, CVS- denies chest pain, palpitations RESP- denies SOB, cough, wheeze ABD- denies N/V, change in stools, abd pain GU- denies dysuria, hematuria, dribbling, incontinence MSK- denies joint pain, muscle aches, injury Neuro- denies headache, dizziness, syncope, seizure activity       Objective:    BP 126/80 mmHg  Pulse 84  Temp(Src) 99 F (37.2 C) (Oral)  Resp 18  Ht 5\' 5"  (1.651 m)  Wt 292 lb (132.45 kg)  BMI 48.59 kg/m2 GEN- NAD, alert and oriented x3, wears glasses HEENT- PERRL, EOMI, non injected sclera, pink conjunctiva, MMM, oropharynx clear, nares clear rhinorrhea, Left TM  injected, dull light reflex, pinna NT, Rigth TM clear, canal clear Neck- Supple, no thyromegaly, no LAD CVS- RRR, no murmur RESP-CTAB ABD-NABS,soft,NT,ND EXT- No edema Pulses- Radial 2+        Assessment & Plan:      Problem List Items Addressed This Visit    None    Visit Diagnoses    Acute left otitis media, recurrence not specified, unspecified otitis media type    -  Primary    Treat with amoxicillin,previous infection led to bells palsy    Relevant Medications    amoxicillin (AMOXIL) tablet    Routine general medical examination at a health care facility        CPE done, TDAP UTD, fasting labs today, will notify his job based on results, discussed need for continued weight loss and healthy eating    Relevant Orders    CBC with Differential/Platelet    Comprehensive metabolic panel    Lipid panel    Nasal congestion        zyrtec as needed, has seasonal allergies    Elevated fasting blood sugar        check A1C, high risk for DM    Relevant Orders    Hemoglobin A1c       Note: This dictation was prepared with Dragon dictation along with smaller phrase technology. Any transcriptional errors that result from this process are unintentional.

## 2015-01-03 NOTE — Patient Instructions (Signed)
I recommend eye visit once a year I recommend dental visit every 6 months Goal is to  Exercise 30 minutes 5 days a week We will call with lab results  F/U pending results

## 2015-01-04 ENCOUNTER — Other Ambulatory Visit: Payer: Self-pay | Admitting: *Deleted

## 2015-01-04 MED ORDER — BLOOD GLUCOSE MONITOR KIT: PACK | Status: DC

## 2015-01-05 ENCOUNTER — Ambulatory Visit (INDEPENDENT_AMBULATORY_CARE_PROVIDER_SITE_OTHER): Payer: 59 | Admitting: Family Medicine

## 2015-01-05 ENCOUNTER — Encounter: Payer: Self-pay | Admitting: Family Medicine

## 2015-01-05 VITALS — BP 126/88 | HR 78 | Temp 98.7°F | Resp 18 | Ht 65.0 in | Wt 292.0 lb

## 2015-01-05 DIAGNOSIS — E785 Hyperlipidemia, unspecified: Secondary | ICD-10-CM | POA: Diagnosis not present

## 2015-01-05 DIAGNOSIS — E119 Type 2 diabetes mellitus without complications: Secondary | ICD-10-CM

## 2015-01-05 MED ORDER — METFORMIN HCL 1000 MG PO TABS: 1000.0000 mg | ORAL_TABLET | Freq: Two times a day (BID) | ORAL | Status: DC

## 2015-01-05 MED ORDER — LISINOPRIL 2.5 MG PO TABS: 2.5000 mg | ORAL_TABLET | Freq: Every day | ORAL | Status: DC

## 2015-01-05 NOTE — Assessment & Plan Note (Signed)
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 No renal impairment is noted Start metformin titrate to 1000mg  BID over next 2 weeks, check CBG BID, discussed dietary changes, d/c soda, eating regular meals, given handouts on nutrition with he does seem to be knowledge of the correct way to eat regarding's carbs/proteins and fats. RTC in 2 weeks, if CBG are down below 200's I will release him to drive. Start low dose ACEI

## 2015-01-05 NOTE — Progress Notes (Signed)
Patient ID: Bruce CoilKevin A Janco, male   DOB: 11-12-1985, 29 y.o.   MRN: [id number]   Subjective:    Patient ID: Bruce CoilKevin A Snedden, male    Patient here to follow-up labsDOB: 11-12-1985, 29 y.o.   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 Lipids also very elevated TG in 800's, LDL could not be calculated.  Denies polyuria, polydipisa. Tends to drink 3 - 20 ounces of sprite a day, otherwise water and he eats 1 large meal around 5pm    Review Of Systems:  GEN- denies fatigue, fever, weight loss,weakness, recent illness HEENT- denies eye drainage, change in vision, nasal discharge, CVS- denies chest pain, palpitations RESP- denies SOB, cough, wheeze ABD- denies N/V, change in stools, abd pain GU- denies dysuria, hematuria, dribbling, incontinence MSK- denies joint pain, muscle aches, injury Neuro- denies headache, dizziness, syncope, seizure activity       Objective:    BP 126/88 mmHg  Pulse 78  Temp(Src) 98.7 F (37.1 C) (Oral)  Resp 18  Ht 5\' 5"  (1.651 m)  Wt 292 lb (132.45 kg)  BMI 48.59 kg/m2 GEN- NAD, alert and oriented x3         Assessment & Plan:      Problem List Items Addressed This Visit    None    Visit Diagnoses    Type 2 diabetes mellitus without complication    -  Primary    Relevant Medications    metFORMIN (GLUCOPHAGE) tablet    lisinopril (PRINIVIL,ZESTRIL) tablet    Hyperlipidemia        Relevant Medications    lisinopril (PRINIVIL,ZESTRIL) tablet       Note: This dictation was prepared with Dragon dictation along with smaller Lobbyistphrase technology. Any transcriptional errors that result from this process are unintentional.

## 2015-01-05 NOTE — Patient Instructions (Addendum)
Take 1/2 tablet with dinner for 3 days, then increase to 1/2 tablet twice a day for 1 week, then 1 tablet twice a day  Check blood sugars twice a day, fasting and after dinner and record Start lisinopril low dose for your kidney protection F/U 2 weeks  Diabetes Mellitus and Food It is important for you to manage your blood sugar (glucose) level. Your blood glucose level can be greatly affected by what you eat. Eating healthier foods in the appropriate amounts throughout the day at about the same time each day will help you control your blood glucose level. It can also help slow or prevent worsening of your diabetes mellitus. Healthy eating may even help you improve the level of your blood pressure and reach or maintain a healthy weight.  HOW CAN FOOD AFFECT ME? Carbohydrates Carbohydrates affect your blood glucose level more than any other type of food. Your dietitian will help you determine how many carbohydrates to eat at each meal and teach you how to count carbohydrates. Counting carbohydrates is important to keep your blood glucose at a healthy level, especially if you are using insulin or taking certain medicines for diabetes mellitus. Alcohol Alcohol can cause sudden decreases in blood glucose (hypoglycemia), especially if you use insulin or take certain medicines for diabetes mellitus. Hypoglycemia can be a life-threatening condition. Symptoms of hypoglycemia (sleepiness, dizziness, and disorientation) are similar to symptoms of having too much alcohol.  If your health care provider has given you approval to drink alcohol, do so in moderation and use the following guidelines:  Women should not have more than one drink per day, and men should not have more than two drinks per day. One drink is equal to:  12 oz of beer.  5 oz of wine.  1 oz of hard liquor.  Do not drink on an empty stomach.  Keep yourself hydrated. Have water, diet soda, or unsweetened iced tea.  Regular soda, juice,  and other mixers might contain a lot of carbohydrates and should be counted. WHAT FOODS ARE NOT RECOMMENDED? As you make food choices, it is important to remember that all foods are not the same. Some foods have fewer nutrients per serving than other foods, even though they might have the same number of calories or carbohydrates. It is difficult to get your body what it needs when you eat foods with fewer nutrients. Examples of foods that you should avoid that are high in calories and carbohydrates but low in nutrients include:  Trans fats (most processed foods list trans fats on the Nutrition Facts label).  Regular soda.  Juice.  Candy.  Sweets, such as cake, pie, doughnuts, and cookies.  Fried foods. WHAT FOODS CAN I EAT? Have nutrient-rich foods, which will nourish your body and keep you healthy. The food you should eat also will depend on several factors, including:  The calories you need.  The medicines you take.  Your weight.  Your blood glucose level.  Your blood pressure level.  Your cholesterol level. You also should eat a variety of foods, including:  Protein, such as meat, poultry, fish, tofu, nuts, and seeds (lean animal proteins are best).  Fruits.  Vegetables.  Dairy products, such as milk, cheese, and yogurt (low fat is best).  Breads, grains, pasta, cereal, rice, and beans.  Fats such as olive oil, trans fat-free margarine, canola oil, avocado, and olives. DOES EVERYONE WITH DIABETES MELLITUS HAVE THE SAME MEAL PLAN? Because every person with diabetes mellitus is different,  there is not one meal plan that works for everyone. It is very important that you meet with a dietitian who will help you create a meal plan that is just right for you. Document Released: 07/19/2005 Document Revised: 10/27/2013 Document Reviewed: 09/18/2013 [facility] Patient Information 2015 [place], [place]. This information is not intended to replace advice given to you by your health  care provider. Make sure you discuss any questions you have with your health care provider.

## 2015-01-05 NOTE — Assessment & Plan Note (Signed)
Will not treat until CBG start to come down, TG reacting to the hyperglycemia He will need statin drug in future

## 2015-01-24 ENCOUNTER — Ambulatory Visit (INDEPENDENT_AMBULATORY_CARE_PROVIDER_SITE_OTHER): Payer: 59 | Admitting: Family Medicine

## 2015-01-24 ENCOUNTER — Encounter: Payer: Self-pay | Admitting: Family Medicine

## 2015-01-24 VITALS — BP 128/68 | HR 72 | Temp 98.8°F | Resp 16 | Ht 65.0 in | Wt 293.0 lb

## 2015-01-24 DIAGNOSIS — E119 Type 2 diabetes mellitus without complications: Secondary | ICD-10-CM | POA: Diagnosis not present

## 2015-01-24 NOTE — Progress Notes (Signed)
Patient ID: Bruce Henry, male   DOB: September 14, 1986, 29 y.o.   MRN: [id number]   Subjective:    Patient ID: Bruce CoilKevin A Henry, male    DOB: September 14, 1986, 29 y.o.   MRN: [id number]  Patient presents for F/U   Pt here to f/u DM, he was diagnosed with Type II DM, 2 weeks ago, A1C was 12.2%. Started MTF, titrated up to 1000mg  BID. Had diarrhea nausea initially now resolved. CBG past 2 weeks have been 107-214, avg 130's, no hypoglycemia, besides 1st week starting meds no CBG > 200.   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 No concerns today    Review Of Systems:  GEN- denies fatigue, fever, weight loss,weakness, recent illness HEENT- denies eye drainage, change in vision, nasal discharge, CVS- denies chest pain, palpitations RESP- denies SOB, cough, wheeze ABD- denies N/V, change in stools, abd pain GU- denies dysuria, hematuria, dribbling, incontinence MSK- denies joint pain, muscle aches, injury Neuro- denies headache, dizziness, syncope, seizure activity       Objective:    BP 128/68 mmHg  Pulse 72  Temp(Src) 98.8 F (37.1 C) (Oral)  Resp 16  Ht 5\' 5"  (1.651 m)  Wt 293 lb (132.904 kg)  BMI 48.76 kg/m2 GEN- NAD, alert and oriented x3        Assessment & Plan:      Problem List Items Addressed This Visit      Unprioritized   Morbid obesity   Diabetes mellitus, type II - Primary      Note: This dictation was prepared with Dragon dictation along with smaller phrase technology. Any transcriptional errors that result from this process are unintentional.

## 2015-01-24 NOTE — Patient Instructions (Signed)
Continue metformin as prescribed Keep watching the diet  Decrease the testing to twice a day  F/U  1st of June

## 2015-01-24 NOTE — Assessment & Plan Note (Signed)
CBG now optimal compared to initial readings, consistently below 200 with MTF, continue, repeat A1C in 8 weeks Released to drive since CBG are down and on oral medications Continue ACEI Continue with low sugar, low fat diet, which he is doing well with

## 2015-04-06 ENCOUNTER — Ambulatory Visit: Payer: 59 | Admitting: Family Medicine

## 2015-05-02 ENCOUNTER — Other Ambulatory Visit: Payer: Self-pay

## 2016-01-09 ENCOUNTER — Emergency Department (INDEPENDENT_AMBULATORY_CARE_PROVIDER_SITE_OTHER)
Admission: EM | Admit: 2016-01-09 | Discharge: 2016-01-09 | Disposition: A | Discharge status: Home / Self Care | Payer: 59 | Source: Home / Self Care | Attending: Family Medicine | Admitting: Family Medicine

## 2016-01-09 ENCOUNTER — Encounter (HOSPITAL_COMMUNITY): Payer: Self-pay | Admitting: *Deleted

## 2016-01-09 DIAGNOSIS — A0811 Acute gastroenteropathy due to Norwalk agent: Secondary | ICD-10-CM

## 2016-01-09 LAB — POCT I-STAT, CHEM 8
BUN: 5 mg/dL — ABNORMAL LOW (ref 6–20)
CALCIUM ION: 1.15 mmol/L (ref 1.12–1.23)
Chloride: 101 mmol/L (ref 101–111)
Creatinine, Ser: 0.6 mg/dL — ABNORMAL LOW (ref 0.61–1.24)
GLUCOSE: 165 mg/dL — AB (ref 65–99)
HCT: 43 % (ref 39.0–52.0)
Hemoglobin: 14.6 g/dL (ref 13.0–17.0)
Potassium: 3.9 mmol/L (ref 3.5–5.1)
SODIUM: 140 mmol/L (ref 135–145)
TCO2: 28 mmol/L (ref 0–100)

## 2016-01-09 MED ORDER — ONDANSETRON HCL 4 MG PO TABS: 4.0000 mg | ORAL_TABLET | Freq: Four times a day (QID) | ORAL | Status: DC

## 2016-01-09 MED ORDER — ONDANSETRON 4 MG PO TBDP
8.0000 mg | ORAL_TABLET | Freq: Once | ORAL | Status: AC
  Administered 2016-01-09: 8 mg via ORAL

## 2016-01-09 MED ORDER — ONDANSETRON 4 MG PO TBDP
ORAL_TABLET | ORAL | Status: AC
  Filled 2016-01-09: qty 2

## 2016-01-09 NOTE — Discharge Instructions (Signed)
Clear liquid , bland diet tonight as tolerated, advance on tues as improved, use medicine as needed, return or see your doctor if any problems. °

## 2016-01-09 NOTE — ED Notes (Signed)
Pt  Reports  Symptoms  Of  Abdominal  Pain  With  Nausea    Vomiting /  Diarrhea    symptoms  Since  Last   Tuesday         Not  Actively  Vomiting  at this  Juncture  In time

## 2016-01-09 NOTE — ED Provider Notes (Signed)
CSN: [id number]     Arrival date & time 01/09/16  1456 History   First MD Initiated Contact with Patient 01/09/16 1717     Chief Complaint  Patient presents with  . Abdominal Pain   (Consider location/radiation/quality/duration/timing/severity/associated sxs/prior Treatment) Patient is a 30 y.o. male presenting with abdominal pain. The history is provided by the patient.  Abdominal Pain Pain location:  Generalized Pain quality: cramping and sharp   Pain radiates to:  Does not radiate Pain severity:  Mild Onset quality:  Gradual Duration:  6 days Progression:  Unchanged Chronicity:  New Context comment:  N/v/d since tues, no blood, +chills. fever. pt is diabetic. Associated symptoms: diarrhea, nausea and vomiting     Past Medical History  Diagnosis Date  . Obesity   . Bell's palsy 2014  . Allergy     seasonal  . Asthma     Childhod    Past Surgical History  Procedure Laterality Date  . Tonsillectomy    . Shoulder surgery  2003    left  . Anal fissure repair  2007   Family History  Problem Relation Age of Onset  . Cancer Mother     ovarian  . Diabetes Mother   . Diabetes Father   . Hypertension Father   . Diabetes Paternal Grandmother   . Hyperlipidemia Paternal Grandmother   . Cancer Paternal Grandmother     Brain cancer  . Diabetes Brother   . Hypertension Brother   . Diabetes Maternal Grandmother   . Cancer Maternal Grandmother     Colon cancer  . Diabetes Maternal Grandfather   . Cancer Maternal Grandfather     pancreatic cancer  . Diabetes Paternal Uncle    Social History  Substance Use Topics  . Smoking status: Never Smoker   . Smokeless tobacco: Current User    Types: Snuff  . Alcohol Use: 1.2 oz/week    2 Glasses of wine per week     Comment: occasionally    Review of Systems  Constitutional: Negative.   HENT: Negative.   Cardiovascular: Negative.   Gastrointestinal: Positive for nausea, vomiting, abdominal pain and diarrhea. Negative for  blood in stool.  Genitourinary: Negative.   Musculoskeletal: Negative.   All other systems reviewed and are negative.   Allergies  Review of patient's allergies indicates no known allergies.  Home Medications   Prior to Admission medications   Medication Sig Start Date End Date Taking? Authorizing Provider  blood glucose meter kit and supplies KIT Dispense based on patient and insurance preference. Use to monitor FSBS 4x daily D/T fluctuating CBG. Dx:E11.9. 01/04/15   Alycia Rossetti, MD  cetirizine (ZYRTEC) 10 MG tablet Take 1 tablet (10 mg total) by mouth daily. 01/03/15   Alycia Rossetti, MD  lisinopril (ZESTRIL) 2.5 MG tablet Take 1 tablet (2.5 mg total) by mouth daily. 01/05/15   Alycia Rossetti, MD  metFORMIN (GLUCOPHAGE) 1000 MG tablet Take 1 tablet (1,000 mg total) by mouth 2 (two) times daily with a meal. 01/05/15   Alycia Rossetti, MD  ondansetron (ZOFRAN) 4 MG tablet Take 1 tablet (4 mg total) by mouth every 6 (six) hours. Prn n/v 01/09/16   Billy Fischer, MD   Meds Ordered and Administered this Visit   Medications  ondansetron (ZOFRAN-ODT) disintegrating tablet 8 mg (8 mg Oral Given 01/09/16 1801)    BP 112/73 mmHg  Pulse 85  Temp(Src) 98.5 F (36.9 C) (Oral)  Resp 20  SpO2 94% No  data found.   Physical Exam  Constitutional: He is oriented to person, place, and time. He appears well-developed and well-nourished. He appears distressed.  HENT:  Mouth/Throat: Oropharynx is clear and moist.  Neck: Normal range of motion. Neck supple.  Cardiovascular: Normal rate, normal heart sounds and intact distal pulses.   Pulmonary/Chest: Effort normal and breath sounds normal.  Abdominal: Soft. He exhibits no distension and no mass. There is no tenderness. There is no rebound and no guarding.  Lymphadenopathy:    He has no cervical adenopathy.  Neurological: He is alert and oriented to person, place, and time.  Skin: Skin is warm and dry.  Nursing note and vitals reviewed.   ED  Course  Procedures (including critical care time)  Labs Review Labs Reviewed  POCT I-STAT, CHEM 8 - Abnormal; Notable for the following:    BUN 5 (*)    Creatinine, Ser 0.60 (*)    Glucose, Bld 165 (*)    All other components within normal limits    Imaging Review No results found.   Visual Acuity Review  Right Eye Distance:   Left Eye Distance:   Bilateral Distance:    Right Eye Near:   Left Eye Near:    Bilateral Near:         MDM   1. Gastroenteritis due to norovirus    Tolerating po at time of d/c. Meds ordered this encounter  Medications  . ondansetron (ZOFRAN-ODT) disintegrating tablet 8 mg    Sig:   . ondansetron (ZOFRAN) 4 MG tablet    Sig: Take 1 tablet (4 mg total) by mouth every 6 (six) hours. Prn n/v    Dispense:  10 tablet    Refill:  0      Billy Fischer, MD 01/09/16 [phone]

## 2016-01-09 NOTE — ED Notes (Signed)
Pt  Tolerating  Po  Fluids   No  Vomiting

## 2016-06-15 ENCOUNTER — Ambulatory Visit: Payer: Self-pay | Admitting: Family Medicine

## 2016-06-22 ENCOUNTER — Ambulatory Visit: Payer: Self-pay | Admitting: Family Medicine

## 2017-08-09 ENCOUNTER — Ambulatory Visit (INDEPENDENT_AMBULATORY_CARE_PROVIDER_SITE_OTHER): Payer: BLUE CROSS/BLUE SHIELD | Admitting: Family Medicine

## 2017-08-09 ENCOUNTER — Encounter: Payer: Self-pay | Admitting: Family Medicine

## 2017-08-09 VITALS — BP 104/62 | HR 84 | Temp 99.4°F | Resp 20 | Ht 64.0 in | Wt 302.0 lb

## 2017-08-09 DIAGNOSIS — Z202 Contact with and (suspected) exposure to infections with a predominantly sexual mode of transmission: Secondary | ICD-10-CM | POA: Diagnosis not present

## 2017-08-09 DIAGNOSIS — K5900 Constipation, unspecified: Secondary | ICD-10-CM | POA: Insufficient documentation

## 2017-08-09 DIAGNOSIS — K439 Ventral hernia without obstruction or gangrene: Secondary | ICD-10-CM | POA: Diagnosis not present

## 2017-08-09 DIAGNOSIS — E118 Type 2 diabetes mellitus with unspecified complications: Secondary | ICD-10-CM | POA: Diagnosis not present

## 2017-08-09 MED ORDER — METRONIDAZOLE 500 MG PO TABS: 500.0000 mg | ORAL_TABLET | Freq: Two times a day (BID) | ORAL | 0 refills | Status: DC

## 2017-08-09 MED ORDER — METFORMIN HCL 500 MG PO TABS
500.0000 mg | ORAL_TABLET | Freq: Two times a day (BID) | ORAL | 3 refills | Status: DC
Start: 2017-08-09 — End: 2017-08-23

## 2017-08-09 MED ORDER — LINACLOTIDE 145 MCG PO CAPS: 145.0000 ug | ORAL_CAPSULE | Freq: Every day | ORAL | 0 refills | Status: DC

## 2017-08-09 NOTE — Progress Notes (Signed)
Subjective:    Patient ID: Bruce Henry, male    DOB: 15-Aug-1986, 31 y.o.   MRN: [id number]  Patient presents for Stomach issues (Bloating, feeling full quickly, hardness)  Patient here with complaints of GI upset however he is diabetic type II has not had follow-up since 2016 when he was initially diagnosed His A1c was 12.2% at that time He has gained about 10 pounds since his visit last marker 2016 is now 302 pounds with a BMI of 51 States that he has been constipated and very gassy sometimes it feels tight and in the middle the stomach. He denies any reflux symptoms. He is also noted a small bulge when he does a sit up.  He then tells me that his girlfriend cheated and recently was diagnosed with Trichomonas he has not been treated for this. He has an odor to his urine but no burning with urination. He has also had urinary frequency for the past 2 months.  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 He states that when he did take the metformin last time his blood sugars came down fairly quickly        Review Of Systems:  GEN- denies fatigue, fever, weight loss,weakness, recent illness HEENT- denies eye drainage, change in vision, nasal discharge, CVS- denies chest pain, palpitations RESP- denies SOB, cough, wheeze ABD- denies N/V, +change in stools, +abd pain GU- denies dysuria, hematuria, dribbling, incontinence MSK- denies joint pain, muscle aches, injury Neuro- denies headache, dizziness, syncope, seizure activity       Objective:    BP 104/62   Pulse 84   Temp 99.4 F (37.4 C) (Oral)   Resp 20   Ht  (1.626 m)   Wt (!) 302 lb (137 kg)   BMI 51.84 kg/m  GEN- NAD, alert and oriented x3 HEENT- PERRL, EOMI, non injected sclera, pink conjunctiva, MMM, oropharynx clear Neck- Supple, no thyromegaly CVS- RRR, no murmur RESP-CTAB ABD-NABS,soft,large abdomen- small ventral hernia, NT  EXT- No edema Pulses- Radial 2+        Assessment & Plan:       Problem List Items Addressed This Visit      Unprioritized   Morbid obesity (HCC)   Relevant Medications   metFORMIN (GLUCOPHAGE) 500 MG tablet   Ventral hernia   Diabetes mellitus, type II (HCC) - Primary    Diabetes is uncontrolled A1c result at 12.9% here in the office. My concern is that he may have some gastroparesis but it can also be just his diet with regards to feeling fully gassiness and his constipation. We will start with metformin 500 twice a day glucometer was sent to the pharmacy he is to check twice a day. He declines any insulin therapy. He will follow-up in 2 weeks for his blood sugar. I had given him samples of linzess 72 mg to try.  With regard to the STD screening on went ahead and gave him Rocephin 250 mg IM and I will send him Flagyl as he does note that she had trichomonas and he has not been treated for this. We'll follow-up his labs next week.  He is working out with a Psychologist, educational at [employer] states that he started yesterday of some cardio with regards to the small ventral hernia at this time easily reduced and with his large habitus I do not think you be a good surgical candidate in his blood sugar is very high.  Relevant Medications   metFORMIN (GLUCOPHAGE) 500 MG tablet   Other Relevant Orders   CBC with Differential/Platelet   Comprehensive metabolic panel   Hemoglobin A1C, fingerstick (Completed)   Microalbumin / creatinine urine ratio   Constipation    Other Visit Diagnoses    Exposure to sexually transmitted disease (STD)       Relevant Orders   Urinalysis, Routine w reflex microscopic (Completed)   C. trachomatis/N. gonorrhoeae RNA   HIV antibody   RPR      Note: This dictation was prepared with Dragon dictation along with smaller phrase technology. Any transcriptional errors that result from this process are unintentional.

## 2017-08-09 NOTE — Patient Instructions (Signed)
Take linzess once a day for constipation Plenty of water  Take Flagyl twice a day for a week  Start metformin  New machine to check blood sugar twice a day  F/U 2 weeks

## 2017-08-09 NOTE — Assessment & Plan Note (Signed)
Diabetes is uncontrolled A1c result at 12.9% here in the office. My concern is that he may have some gastroparesis but it can also be just his diet with regards to feeling fully gassiness and his constipation. We will start with metformin 500 twice a day glucometer was sent to the pharmacy he is to check twice a day. He declines any insulin therapy. He will follow-up in 2 weeks for his blood sugar. I had given him samples of linzess 72 mg to try.  With regard to the STD screening on went ahead and gave him Rocephin 250 mg IM and I will send him Flagyl as he does note that she had trichomonas and he has not been treated for this. We'll follow-up his labs next week.  He is working out with a Psychologist, educational at [employer] states that he started yesterday of some cardio with regards to the small ventral hernia at this time easily reduced and with his large habitus I do not think you be a good surgical candidate in his blood sugar is very high.

## 2017-08-10 LAB — URINALYSIS, ROUTINE W REFLEX MICROSCOPIC
Bacteria, UA: NONE SEEN /HPF
Bilirubin Urine: NEGATIVE
Hgb urine dipstick: NEGATIVE
Hyaline Cast: NONE SEEN /LPF
Ketones, ur: NEGATIVE
NITRITE: NEGATIVE
PH: 6 (ref 5.0–8.0)
Protein, ur: NEGATIVE
RBC / HPF: NONE SEEN /HPF (ref 0–2)
SPECIFIC GRAVITY, URINE: 1.02 (ref 1.001–1.03)
SQUAMOUS EPITHELIAL / LPF: NONE SEEN /HPF (ref <=5)

## 2017-08-10 LAB — MICROSCOPIC MESSAGE

## 2017-08-10 LAB — MICROALBUMIN / CREATININE URINE RATIO
CREATININE, URINE: 127 mg/dL (ref 20–320)
MICROALB UR: 0.7 mg/dL
Microalb Creat Ratio: 6 mcg/mg creat (ref <30)

## 2017-08-10 LAB — C. TRACHOMATIS/N. GONORRHOEAE RNA
C. TRACHOMATIS RNA, TMA: DETECTED — AB
N. GONORRHOEAE RNA, TMA: NOT DETECTED

## 2017-08-12 ENCOUNTER — Other Ambulatory Visit: Payer: Self-pay | Admitting: *Deleted

## 2017-08-12 LAB — CBC WITH DIFFERENTIAL/PLATELET
BASOS PCT: 0 %
Basophils Absolute: 0 cells/uL (ref 0–200)
EOS ABS: 147 {cells}/uL (ref 15–500)
Eosinophils Relative: 1.6 %
HEMATOCRIT: 39.2 % (ref 38.5–50.0)
HEMOGLOBIN: 13.1 g/dL — AB (ref 13.2–17.1)
LYMPHS ABS: 4563 {cells}/uL — AB (ref 850–3900)
MCH: 27.8 pg (ref 27.0–33.0)
MCHC: 33.4 g/dL (ref 32.0–36.0)
MCV: 83.1 fL (ref 80.0–100.0)
MPV: 11.5 fL (ref 7.5–12.5)
Monocytes Relative: 8.5 %
Neutro Abs: 3708 cells/uL (ref 1500–7800)
Neutrophils Relative %: 40.3 %
Platelets: 277 10*3/uL (ref 140–400)
RBC: 4.72 10*6/uL (ref 4.20–5.80)
RDW: 13.6 % (ref 11.0–15.0)
Total Lymphocyte: 49.6 %
WBC: 9.2 10*3/uL (ref 3.8–10.8)
WBCMIX: 782 {cells}/uL (ref 200–950)

## 2017-08-12 LAB — COMPREHENSIVE METABOLIC PANEL
AG RATIO: 1.1 (calc) (ref 1.0–2.5)
ALBUMIN MSPROF: 3.8 g/dL (ref 3.6–5.1)
ALKALINE PHOSPHATASE (APISO): 71 U/L (ref 40–115)
ALT: 29 U/L (ref 9–46)
AST: 27 U/L (ref 10–40)
BILIRUBIN TOTAL: 0.3 mg/dL (ref 0.2–1.2)
BUN: 8 mg/dL (ref 7–25)
CALCIUM: 9 mg/dL (ref 8.6–10.3)
CHLORIDE: 99 mmol/L (ref 98–110)
CO2: 25 mmol/L (ref 20–32)
CREATININE: 0.73 mg/dL (ref 0.60–1.35)
GLOBULIN: 3.4 g/dL (ref 1.9–3.7)
Glucose, Bld: 265 mg/dL — ABNORMAL HIGH (ref 65–99)
POTASSIUM: 3.9 mmol/L (ref 3.5–5.3)
Sodium: 133 mmol/L — ABNORMAL LOW (ref 135–146)
Total Protein: 7.2 g/dL (ref 6.1–8.1)

## 2017-08-12 LAB — HEMOGLOBIN A1C, FINGERSTICK: Hgb A1C (fingerstick): 12.9 % OF TOTAL HGB — ABNORMAL HIGH (ref <6.0)

## 2017-08-12 LAB — HIV ANTIBODY (ROUTINE TESTING W REFLEX): HIV: NONREACTIVE

## 2017-08-12 LAB — RPR: RPR: NONREACTIVE

## 2017-08-12 MED ORDER — CEFTRIAXONE SODIUM 250 MG IJ SOLR
250.0000 mg | Freq: Once | INTRAMUSCULAR | Status: AC
  Administered 2017-08-09: 250 mg via INTRAMUSCULAR

## 2017-08-12 MED ORDER — LANCETS MISC: 1 refills | Status: DC

## 2017-08-12 MED ORDER — BLOOD GLUCOSE TEST VI STRP: ORAL_STRIP | 1 refills | Status: DC

## 2017-08-12 MED ORDER — BLOOD GLUCOSE SYSTEM PAK KIT: PACK | 1 refills | Status: DC

## 2017-08-12 MED ORDER — AZITHROMYCIN 500 MG PO TABS: 1000.0000 mg | ORAL_TABLET | Freq: Once | ORAL | 0 refills | Status: AC

## 2017-08-12 MED ORDER — LANCET DEVICES MISC: 1 refills | Status: DC

## 2017-08-12 NOTE — Addendum Note (Signed)
Addended by: Legrand Rams B on: 08/12/2017 02:51 PM   Modules accepted: Orders

## 2017-08-23 ENCOUNTER — Ambulatory Visit (INDEPENDENT_AMBULATORY_CARE_PROVIDER_SITE_OTHER): Payer: BLUE CROSS/BLUE SHIELD | Admitting: Family Medicine

## 2017-08-23 ENCOUNTER — Encounter: Payer: Self-pay | Admitting: Family Medicine

## 2017-08-23 VITALS — BP 104/78 | HR 78 | Temp 98.6°F | Resp 16 | Wt 300.2 lb

## 2017-08-23 DIAGNOSIS — E118 Type 2 diabetes mellitus with unspecified complications: Secondary | ICD-10-CM

## 2017-08-23 DIAGNOSIS — Z23 Encounter for immunization: Secondary | ICD-10-CM | POA: Diagnosis not present

## 2017-08-23 MED ORDER — AZITHROMYCIN 500 MG PO TABS
ORAL_TABLET | ORAL | 0 refills | Status: DC
Start: 2017-08-23 — End: 2017-09-27

## 2017-08-23 MED ORDER — METFORMIN HCL 1000 MG PO TABS: 1000.0000 mg | ORAL_TABLET | Freq: Two times a day (BID) | ORAL | 3 refills | Status: DC

## 2017-08-23 NOTE — Patient Instructions (Addendum)
F/U 2 months  Flu shot and pneumonia vaccine given

## 2017-08-23 NOTE — Progress Notes (Signed)
   Subjective:    Patient ID: Bruce Henry, male    DOB: 05/11/1986, 31 y.o.   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    Did not bring meter today   CBG have been 150-200 [place] water 10-12 bottles of water, protein bars- low sugar, grilled cfoods Working with diet   Constipatoion- given Linzess  nowels moving now, also the extra fiber   Treated for STI last visit CHlamydia and Trichomonas , still has strong odor to urine but improved, he not clearly tell me if his GF was treated for chlamydia or just the trichomonas and they have been sexually active  Due for fliu shot and pna SHOT      Review Of Systems:  GEN- denies fatigue, fever, weight loss,weakness, recent illness HEENT- denies eye drainage, change in vision, nasal discharge, CVS- denies chest pain, palpitations RESP- denies SOB, cough, wheeze ABD- denies N/V, change in stools, abd pain GU- denies dysuria, hematuria, dribbling, incontinence MSK- denies joint pain, muscle aches, injury Neuro- denies headache, dizziness, syncope, seizure activity       Objective:    BP 104/78   Pulse 78   Temp 98.6 F (37 C) (Oral)   Resp 16   Wt (!) 300 lb 3.2 oz (136.2 kg)   SpO2 97%   BMI 51.53 kg/m  GEN- NAD, alert and oriented x3 HEENT- PERRL, EOMI, non injected sclera, pink conjunctiva, MMM, oropharynx clear CVS- RRR, no murmur RESP-CTAB        Assessment & Plan:      Problem List Items Addressed This Visit      Unprioritized   Morbid obesity (HCC)   Relevant Medications   metFORMIN (GLUCOPHAGE) 1000 MG tablet   Diabetes mellitus, type II (HCC) - Primary    Goal is A1C < 7% Increase metformin to 1000mg  BID His CBG have improved Continue with trainor Will not add ACE/Statin  today Concentrate on dietary changes Given handouts for low carb/low sugar meals  With regards to urine, advised to talk to GF, given a repeat script for Azithromycin 1gram, he has had rocephin and flagyl.       Relevant Medications   metFORMIN (GLUCOPHAGE) 1000 MG tablet    Other Visit Diagnoses    Need for 23-polyvalent pneumococcal polysaccharide vaccine       Relevant Orders   Pneumococcal polysaccharide vaccine 23-valent greater than or equal to 2yo subcutaneous/IM (Completed)   Flu vaccine need       Relevant Orders   Flu Vaccine QUAD 36+ mos IM (Completed)      Note: This dictation was prepared with Dragon dictation along with smaller phrase technology. Any transcriptional errors that result from this process are unintentional.

## 2017-08-25 ENCOUNTER — Encounter: Payer: Self-pay | Admitting: Family Medicine

## 2017-08-25 NOTE — Assessment & Plan Note (Signed)
Goal is A1C < 7% Increase metformin to 1000mg  BID His CBG have improved Continue with trainor Will not add ACE/Statin today Concentrate on dietary changes Given handouts for low carb/low sugar meals  With regards to urine, advised to talk to GF, given a repeat script for Azithromycin 1gram, he has had rocephin and flagyl.

## 2017-09-27 ENCOUNTER — Encounter (HOSPITAL_COMMUNITY): Payer: Self-pay | Admitting: Emergency Medicine

## 2017-09-27 ENCOUNTER — Ambulatory Visit (HOSPITAL_COMMUNITY)
Admission: EM | Admit: 2017-09-27 | Discharge: 2017-09-27 | Disposition: A | Discharge status: Home / Self Care | Payer: BLUE CROSS/BLUE SHIELD | Attending: Physician Assistant | Admitting: Physician Assistant

## 2017-09-27 DIAGNOSIS — B029 Zoster without complications: Secondary | ICD-10-CM

## 2017-09-27 MED ORDER — LIDOCAINE 5 % EX OINT: 1.0000 "application " | TOPICAL_OINTMENT | CUTANEOUS | 0 refills | Status: DC | PRN

## 2017-09-27 MED ORDER — NAPROXEN 500 MG PO TABS: 500.0000 mg | ORAL_TABLET | Freq: Two times a day (BID) | ORAL | 0 refills | Status: AC

## 2017-09-27 NOTE — ED Triage Notes (Signed)
PT C/O: abscess on left arm   ONSET: 5 days  SX ALSO INCLUDE: swelling, pain, drainage   DENIES: fevers, chills   TAKING MEDS: neosporin and peroxide w/no relief.   A&O x4... NAD... Ambulatory

## 2017-09-27 NOTE — Discharge Instructions (Addendum)
Take the medications as prescribed.

## 2017-09-27 NOTE — ED Provider Notes (Addendum)
09/27/2017 7:52 PM   DOB: 05-26-1986 / MRN: [id number]  SUBJECTIVE:  Bruce Henry is a 31 y.o. male presenting for painful rash on his right medial arm just proximal to the medial elbow. This has been present now for about 5 days and he associates tingling in the arm distal the lesion.   He has No Known Allergies.   He  has a past medical history of Allergy, Asthma, Bell's palsy (2014), and Obesity.    He  reports that  has never smoked. His smokeless tobacco use includes snuff. He reports that he drinks about 1.2 oz of alcohol per week. He reports that he does not use drugs. He  reports that he currently engages in sexual activity. The patient  has a past surgical history that includes Tonsillectomy; Shoulder surgery (2003); and Anal fissure repair (2007).  His family history includes Cancer in his maternal grandfather, maternal grandmother, mother, and paternal grandmother; Diabetes in his brother, father, maternal grandfather, maternal grandmother, mother, paternal grandmother, and paternal uncle; Hyperlipidemia in his paternal grandmother; Hypertension in his brother and father.  Review of Systems  Constitutional: Negative for chills and fever.  Respiratory: Negative for cough.   Musculoskeletal: Positive for myalgias.  Neurological: Positive for tingling. Negative for dizziness, tremors and headaches.    OBJECTIVE:  BP 133/86 (BP Location: Right Wrist)   Pulse 89   Temp 98.7 F (37.1 C) (Oral)   Resp 18   SpO2 100%   Physical Exam  Constitutional: He appears well-developed. He is active and cooperative.  Non-toxic appearance.  Cardiovascular: Normal rate, S1 normal, S2 normal and normal pulses.  Pulmonary/Chest: Effort normal. No stridor. No tachypnea.  Abdominal: He exhibits no distension.  Musculoskeletal: He exhibits no edema.       Arms: Neurological: He is alert.  Skin: Skin is warm and dry. He is not diaphoretic. No pallor.  Vitals reviewed.   No results found  for this or any previous visit (from the past 72 hour(s)).  No results found.  ASSESSMENT AND PLAN:  The encounter diagnosis was Herpes zoster without complication. Naprosyn and topical lidocaine. RTC as needed.  Advised tight sugar control.     The patient is advised to call or return to clinic if he does not see an improvement in symptoms, or to seek the care of the closest emergency department if he worsens with the above plan.   Deliah BostonMichael Tereka Thorley, MHS, PA-C 09/27/2017 7:52 PM    Bruce Henry, Bruce Davenport L, PA-C 09/27/17 1952    Bruce Henry, Bruce Aune L, PA-C 09/27/17 1953

## 2017-10-23 ENCOUNTER — Ambulatory Visit: Payer: BLUE CROSS/BLUE SHIELD | Admitting: Family Medicine

## 2017-11-04 ENCOUNTER — Encounter: Payer: Self-pay | Admitting: Family Medicine

## 2017-11-04 ENCOUNTER — Ambulatory Visit: Payer: BLUE CROSS/BLUE SHIELD | Admitting: Family Medicine

## 2017-11-04 ENCOUNTER — Other Ambulatory Visit: Payer: Self-pay

## 2017-11-04 VITALS — BP 138/68 | HR 86 | Temp 98.3°F | Resp 16 | Ht 64.0 in | Wt 303.0 lb

## 2017-11-04 DIAGNOSIS — B353 Tinea pedis: Secondary | ICD-10-CM

## 2017-11-04 DIAGNOSIS — E1140 Type 2 diabetes mellitus with diabetic neuropathy, unspecified: Secondary | ICD-10-CM | POA: Diagnosis not present

## 2017-11-04 DIAGNOSIS — E118 Type 2 diabetes mellitus with unspecified complications: Secondary | ICD-10-CM

## 2017-11-04 LAB — HEMOGLOBIN A1C, FINGERSTICK: Hgb A1C (fingerstick): 12.2 % OF TOTAL HGB — ABNORMAL HIGH (ref <6.0)

## 2017-11-04 MED ORDER — SILDENAFIL CITRATE 100 MG PO TABS: 100.0000 mg | ORAL_TABLET | Freq: Every day | ORAL | 1 refills | Status: DC | PRN

## 2017-11-04 NOTE — Patient Instructions (Signed)
F/U 6 WEEKS Start invokana Golds bonds powder  Lamisil cream between toes

## 2017-11-04 NOTE — Progress Notes (Signed)
Subjective:    Patient ID: Bruce Henry, male    DOB: [birth year], 31 y.o.   MRN: [id number]  Patient presents for Follow-up (is not fasting) Patient here to follow-up diabetes mellitus.  Last visit he was treated at the urgent care for possible shingles on his arm.  Diabetes mellitus-he was diagnosed with diabetes 2 years ago.  He reestablish care at the beginning of October of this year.  His weight was 302 pounds at his establishing visit.  Currently on metformin thousand milligrams twice a day his CBGs range -he did not bring his meter with him.  He has not been checking regularly anyway.  He is not change much with his diet. A1c was 12.9% 2 months ago.    He does continue to get episodes where he feels a tingling sensation sometimes over his whole body but he will feel it in his hands and his feet.  Linzess- using for constipation   The end of the visit he asked about medication and for erectile dysfunction.  He has difficulty sustaining his erections.   Review Of Systems:  GEN- denies fatigue, fever, weight loss,weakness, recent illness HEENT- denies eye drainage, change in vision, nasal discharge, CVS- denies chest pain, palpitations RESP- denies SOB, cough, wheeze ABD- denies N/V, change in stools, abd pain GU- denies dysuria, hematuria, dribbling, incontinence MSK- denies joint pain, muscle aches, injury Neuro- denies headache, dizziness, syncope, seizure activity       Objective:    BP 138/68   Pulse 86   Temp 98.3 F (36.8 C) (Oral)   Resp 16   Ht 5\' 4"  (1.626 m)   Wt (!) 303 lb (137.4 kg)   SpO2 96%   BMI 52.01 kg/m  GEN- NAD, alert and oriented x3 HEENT- PERRL, EOMI, non injected sclera, pink conjunctiva, MMM, oropharynx clear CVS- RRR, no murmur RESP-CTAB ABD-NABS,soft,NT,ND EXT- No edema,maceration web spaces feet bilat Pulses- Radial, DP- 2+        Assessment & Plan:      Problem List Items Addressed This Visit      Unprioritized   Diabetes mellitus, type II (HCC) - Primary    Diabetes is uncontrolled interim A1c here in the office 12.2%.  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  I am going to add Invokana 100 mg once a day I have samples of this.  He is going to check his blood sugars regularly.  He will follow-up in 6 weeks. Discussed increasing his water intake cutting out soda and fast food.   The tinea pedis he will use Lamisil cream as well as Goldbond powder.  Has some neuropathy-like symptoms.  Which have improved since we have been treating his diabetes but still present.  I am also going to check a B12 since he has some in multiple extremities.  The episodes however are short-lived so we will not start gabapentin at this time.  Erectile dysfunction multifactorial in the setting of his uncontrolled diabetes and his weight.  He would like to try something to aid him as he brings on his blood sugars will try Viagra discussed the side effects of the medication.  Advised he can take 50 mg      Relevant Orders   Hemoglobin A1C, fingerstick (Completed)   Hemoglobin A1C, fingerstick (Completed)   Basic metabolic panel   Vitamin B12    Other Visit Diagnoses    Tinea pedis of  both feet          Note: This dictation was prepared with Dragon dictation along with smaller phrase technology. Any transcriptional errors that result from this process are unintentional.

## 2017-11-04 NOTE — Assessment & Plan Note (Addendum)
Diabetes is uncontrolled interim A1c here in the office 12.2%.  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  I am going to add Invokana 100 mg once a day I have samples of this.  He is going to check his blood sugars regularly.  He will follow-up in 6 weeks. Discussed increasing his water intake cutting out soda and fast food.   The tinea pedis he will use Lamisil cream as well as Goldbond powder.  Has some neuropathy-like symptoms.  Which have improved since we have been treating his diabetes but still present.  I am also going to check a B12 since he has some in multiple extremities.  The episodes however are short-lived so we will not start gabapentin at this time.  Erectile dysfunction multifactorial in the setting of his uncontrolled diabetes and his weight.  He would like to try something to aid him as he brings on his blood sugars will try Viagra discussed the side effects of the medication.  Advised he can take 50 mg

## 2017-11-05 LAB — BASIC METABOLIC PANEL
BUN: 8 mg/dL (ref 7–25)
CALCIUM: 9.4 mg/dL (ref 8.6–10.3)
CO2: 25 mmol/L (ref 20–32)
Chloride: 101 mmol/L (ref 98–110)
Creat: 0.81 mg/dL (ref 0.60–1.35)
Glucose, Bld: 309 mg/dL — ABNORMAL HIGH (ref 65–99)
POTASSIUM: 4.1 mmol/L (ref 3.5–5.3)
Sodium: 136 mmol/L (ref 135–146)

## 2017-11-05 LAB — VITAMIN B12: Vitamin B-12: 676 pg/mL (ref 200–1100)

## 2017-11-05 LAB — EXTRA LAV TOP TUBE

## 2017-11-07 ENCOUNTER — Encounter: Payer: Self-pay | Admitting: *Deleted

## 2017-11-08 ENCOUNTER — Other Ambulatory Visit: Payer: Self-pay | Admitting: Family Medicine

## 2017-12-16 ENCOUNTER — Encounter: Payer: Self-pay | Admitting: Family Medicine

## 2017-12-16 ENCOUNTER — Ambulatory Visit: Payer: BLUE CROSS/BLUE SHIELD | Admitting: Family Medicine

## 2017-12-16 ENCOUNTER — Other Ambulatory Visit: Payer: Self-pay

## 2017-12-16 VITALS — BP 136/72 | HR 88 | Temp 99.5°F | Resp 14 | Ht 64.0 in | Wt 305.0 lb

## 2017-12-16 DIAGNOSIS — E118 Type 2 diabetes mellitus with unspecified complications: Secondary | ICD-10-CM | POA: Diagnosis not present

## 2017-12-16 DIAGNOSIS — E782 Mixed hyperlipidemia: Secondary | ICD-10-CM | POA: Diagnosis not present

## 2017-12-16 MED ORDER — LINACLOTIDE 145 MCG PO CAPS: 145.0000 ug | ORAL_CAPSULE | Freq: Every day | ORAL | 2 refills | Status: DC

## 2017-12-16 MED ORDER — METFORMIN HCL 500 MG PO TABS: ORAL_TABLET | ORAL | 3 refills | Status: DC

## 2017-12-16 MED ORDER — CANAGLIFLOZIN 300 MG PO TABS: 300.0000 mg | ORAL_TABLET | Freq: Every day | ORAL | 2 refills | Status: DC

## 2017-12-16 NOTE — Progress Notes (Signed)
   Subjective:    Patient ID: Bruce Henry, male    DOB: 22-Apr-1986, 32 y.o.   MRN: [id number]  Patient presents for Follow-up (is not fasting)    DM- last A1C 12.2%, CBG was  200 after eating lunch , has only taken CBG three times since last visit  Taking METFORMIN 500mg  twice a day, ran out of invokana samples last weekk  Exercise- working out 3 days a week, cardio and weights  Continues to try to change his diet  Gets some bloating, passing a lot of gas after eating out of linzess  Review Of Systems:  GEN- denies fatigue, fever, weight loss,weakness, recent illness HEENT- denies eye drainage, change in vision, nasal discharge, CVS- denies chest pain, palpitations RESP- denies SOB, cough, wheeze ABD- denies N/V, change in stools, abd pain GU- denies dysuria, hematuria, dribbling, incontinence MSK- denies joint pain, muscle aches, injury Neuro- denies headache, dizziness, syncope, seizure activity       Objective:    BP 136/72   Pulse 88   Temp 99.5 F (37.5 C) (Oral)   Resp 14   Ht 5\' 4"  (1.626 m)   Wt (!) 305 lb (138.3 kg)   SpO2 98%   BMI 52.35 kg/m  GEN- NAD, alert and oriented x3,bese HEENT- PERRL, EOMI, non injected sclera, pink conjunctiva, MMM, oropharynx clear Neck- Supple, no thyromegaly CVS- RRR, no murmur RESP-CTAB Pulses- Radial  2+        Assessment & Plan:      Problem List Items Addressed This Visit      Unprioritized   Hyperlipidemia   Relevant Orders   Lipid panel   Diabetes mellitus, type II (HCC) - Primary   Relevant Medications   canagliflozin (INVOKANA) 300 MG TABS tablet   metFORMIN (GLUCOPHAGE) 500 MG tablet   Other Relevant Orders   CBC with Differential/Platelet   Comprehensive metabolic panel   Hemoglobin A1c   Morbid obesity (HCC)    Continue to work on dietary changes  Restart linzess for constipation, also can try probiotics  Discussed importance of checking his blood sugar, to help control diet and intake. He  has DOT in 4 weeks, obtain intermin A1C today. Continue metformin and Invokana 300mg       Relevant Medications   canagliflozin (INVOKANA) 300 MG TABS tablet   metFORMIN (GLUCOPHAGE) 500 MG tablet      Note: This dictation was prepared with Dragon dictation along with smaller phrase technology. Any transcriptional errors that result from this process are unintentional.

## 2017-12-16 NOTE — Assessment & Plan Note (Signed)
Continue to work on dietary changes  Restart linzess for constipation, also can try probiotics  Discussed importance of checking his blood sugar, to help control diet and intake. He has DOT in 4 weeks, obtain intermin A1C today. Continue metformin and Invokana 300mg 

## 2017-12-16 NOTE — Patient Instructions (Signed)
Invokana daily Check your blood sugar  Linzess for constipation F/U 2 months

## 2017-12-17 LAB — CBC WITH DIFFERENTIAL/PLATELET
BASOS PCT: 0.2 %
Basophils Absolute: 23 cells/uL (ref 0–200)
Eosinophils Absolute: 262 cells/uL (ref 15–500)
Eosinophils Relative: 2.3 %
HCT: 40.8 % (ref 38.5–50.0)
Hemoglobin: 13.5 g/dL (ref 13.2–17.1)
Lymphs Abs: 5210 cells/uL — ABNORMAL HIGH (ref 850–3900)
MCH: 27.3 pg (ref 27.0–33.0)
MCHC: 33.1 g/dL (ref 32.0–36.0)
MCV: 82.4 fL (ref 80.0–100.0)
MONOS PCT: 7.1 %
MPV: 10.8 fL (ref 7.5–12.5)
Neutro Abs: 5096 cells/uL (ref 1500–7800)
Neutrophils Relative %: 44.7 %
PLATELETS: 342 10*3/uL (ref 140–400)
RBC: 4.95 10*6/uL (ref 4.20–5.80)
RDW: 13.6 % (ref 11.0–15.0)
TOTAL LYMPHOCYTE: 45.7 %
WBC mixed population: 809 cells/uL (ref 200–950)
WBC: 11.4 10*3/uL — ABNORMAL HIGH (ref 3.8–10.8)

## 2017-12-17 LAB — LIPID PANEL
Cholesterol: 249 mg/dL — ABNORMAL HIGH (ref <200)
HDL: 39 mg/dL — AB (ref >40)
LDL CHOLESTEROL (CALC): 149 mg/dL — AB
Non-HDL Cholesterol (Calc): 210 mg/dL (calc) — ABNORMAL HIGH (ref <130)
Total CHOL/HDL Ratio: 6.4 (calc) — ABNORMAL HIGH (ref <5.0)
Triglycerides: 396 mg/dL — ABNORMAL HIGH (ref <150)

## 2017-12-17 LAB — COMPREHENSIVE METABOLIC PANEL
AG Ratio: 1.2 (calc) (ref 1.0–2.5)
ALBUMIN MSPROF: 4 g/dL (ref 3.6–5.1)
ALT: 24 U/L (ref 9–46)
AST: 20 U/L (ref 10–40)
Alkaline phosphatase (APISO): 59 U/L (ref 40–115)
BUN: 10 mg/dL (ref 7–25)
CHLORIDE: 100 mmol/L (ref 98–110)
CO2: 27 mmol/L (ref 20–32)
CREATININE: 0.8 mg/dL (ref 0.60–1.35)
Calcium: 9.3 mg/dL (ref 8.6–10.3)
GLOBULIN: 3.3 g/dL (ref 1.9–3.7)
GLUCOSE: 243 mg/dL — AB (ref 65–99)
POTASSIUM: 3.9 mmol/L (ref 3.5–5.3)
Sodium: 136 mmol/L (ref 135–146)
Total Bilirubin: 0.4 mg/dL (ref 0.2–1.2)
Total Protein: 7.3 g/dL (ref 6.1–8.1)

## 2017-12-17 LAB — HEMOGLOBIN A1C
EAG (MMOL/L): 14.3 (calc)
Hgb A1c MFr Bld: 10.6 % of total Hgb — ABNORMAL HIGH (ref <5.7)
Mean Plasma Glucose: 258 (calc)

## 2018-02-14 ENCOUNTER — Other Ambulatory Visit: Payer: Self-pay

## 2018-02-14 ENCOUNTER — Ambulatory Visit: Payer: BLUE CROSS/BLUE SHIELD | Admitting: Family Medicine

## 2018-02-14 ENCOUNTER — Encounter: Payer: Self-pay | Admitting: Family Medicine

## 2018-02-14 VITALS — BP 130/62 | HR 80 | Temp 98.7°F | Resp 14 | Ht 64.0 in | Wt 302.0 lb

## 2018-02-14 DIAGNOSIS — E118 Type 2 diabetes mellitus with unspecified complications: Secondary | ICD-10-CM | POA: Diagnosis not present

## 2018-02-14 DIAGNOSIS — K5900 Constipation, unspecified: Secondary | ICD-10-CM | POA: Diagnosis not present

## 2018-02-14 LAB — HEMOGLOBIN A1C, FINGERSTICK: HEMOGLOBIN A1C, FINGERSTICK: 10.1 %{Hb} — AB (ref <6.0)

## 2018-02-14 MED ORDER — LISINOPRIL 2.5 MG PO TABS: 2.5000 mg | ORAL_TABLET | Freq: Every day | ORAL | 3 refills | Status: DC

## 2018-02-14 NOTE — Assessment & Plan Note (Addendum)
We will call pharmacy and see which medication is expensive or not on formulary  Given samples of Invokana 300mg  Continue metformin Start lisinopril 2.5mg  for renal protection Pt to schedule eye visit  A1C returned at 10.1%

## 2018-02-14 NOTE — Assessment & Plan Note (Signed)
Will get pharmacy on phone to see if linzess or amitiza covered Given samples of linzess again which has helped

## 2018-02-14 NOTE — Assessment & Plan Note (Signed)
Continue to work on dietary changes Add cardio to exercise, currentlyj ust lifting weights

## 2018-02-14 NOTE — Patient Instructions (Signed)
F/U  3 months 

## 2018-02-14 NOTE — Progress Notes (Signed)
   Subjective:    Patient ID: Bruce Henry, male    DOB: 1986-06-06, 32 y.o.   MRN: [id number]  Patient presents for Follow-up (is fasting)  Pt here to f/u diabetes mellitus for interim  Pt here to f/u DM- he ha sbeen on metformin and Invokana  10.6%  Had DOT exam.    CBG have been 150-200 fasting, Continues to eat late  Weight down 3lbs since Feb  Drinks protein shake in the morning, has small snacks throughout the day, but late dinner  cut out soda Still working out   Had high TG on last check 396 in Feb    Constipation- trying magneisum citrate which helps a little, miralax did not help    Insurance is not covering linzess/invokana ??    Review Of Systems:  GEN- denies fatigue, fever, weight loss,weakness, recent illness HEENT- denies eye drainage, change in vision, nasal discharge, CVS- denies chest pain, palpitations RESP- denies SOB, cough, wheeze ABD- denies N/V, change in stools, abd pain GU- denies dysuria, hematuria, dribbling, incontinence MSK- denies joint pain, muscle aches, injury Neuro- denies headache, dizziness, syncope, seizure activity       Objective:    BP 130/62   Pulse 80   Temp 98.7 F (37.1 C) (Oral)   Resp 14   Ht 5\' 4"  (1.626 m)   Wt (!) 302 lb (137 kg)   SpO2 96%   BMI 51.84 kg/m  GEN- NAD, alert and oriented x3,obese HEENT- PERRL, EOMI, non injected sclera, pink conjunctiva, MMM, oropharynx clear,wears glasses  CVS- RRR, no murmur RESP-CTAB EXT- No edema Pulses- Radial  2+        Assessment & Plan:      Problem List Items Addressed This Visit      Unprioritized   Morbid obesity (HCC)    Continue to work on dietary changes Add cardio to exercise, currentlyj ust lifting weights       Diabetes mellitus, type II (HCC) - Primary    We will call pharmacy and see which medication is expensive or not on formulary  Given samples of Invokana 300mg  Continue metformin Start lisinopril 2.5mg  for renal protection Pt to  schedule eye visit  A1C returned at 10.1%       Relevant Medications   lisinopril (ZESTRIL) 2.5 MG tablet   Other Relevant Orders   Hemoglobin A1C, fingerstick (Completed)   Constipation    Will get pharmacy on phone to see if linzess or amitiza covered Given samples of linzess again which has helped         Note: This dictation was prepared with Dragon dictation along with smaller phrase technology. Any transcriptional errors that result from this process are unintentional.

## 2018-02-16 ENCOUNTER — Encounter: Payer: Self-pay | Admitting: Family Medicine

## 2018-05-16 ENCOUNTER — Ambulatory Visit: Payer: BLUE CROSS/BLUE SHIELD | Admitting: Family Medicine

## 2018-05-26 ENCOUNTER — Encounter: Payer: Self-pay | Admitting: Family Medicine

## 2018-06-24 ENCOUNTER — Encounter (HOSPITAL_COMMUNITY): Payer: Self-pay | Admitting: Emergency Medicine

## 2018-06-24 ENCOUNTER — Emergency Department (HOSPITAL_COMMUNITY)
Admission: EM | Admit: 2018-06-24 | Discharge: 2018-06-24 | Disposition: A | Discharge status: Home / Self Care | Payer: No Typology Code available for payment source | Attending: Emergency Medicine | Admitting: Emergency Medicine

## 2018-06-24 ENCOUNTER — Other Ambulatory Visit: Payer: Self-pay

## 2018-06-24 DIAGNOSIS — T782XXA Anaphylactic shock, unspecified, initial encounter: Secondary | ICD-10-CM | POA: Diagnosis not present

## 2018-06-24 DIAGNOSIS — Y939 Activity, unspecified: Secondary | ICD-10-CM | POA: Diagnosis not present

## 2018-06-24 DIAGNOSIS — Y929 Unspecified place or not applicable: Secondary | ICD-10-CM | POA: Diagnosis not present

## 2018-06-24 DIAGNOSIS — S1086XA Insect bite of other specified part of neck, initial encounter: Secondary | ICD-10-CM | POA: Insufficient documentation

## 2018-06-24 DIAGNOSIS — J45909 Unspecified asthma, uncomplicated: Secondary | ICD-10-CM | POA: Insufficient documentation

## 2018-06-24 DIAGNOSIS — W57XXXA Bitten or stung by nonvenomous insect and other nonvenomous arthropods, initial encounter: Secondary | ICD-10-CM | POA: Insufficient documentation

## 2018-06-24 DIAGNOSIS — S40861A Insect bite (nonvenomous) of right upper arm, initial encounter: Secondary | ICD-10-CM | POA: Diagnosis not present

## 2018-06-24 DIAGNOSIS — E119 Type 2 diabetes mellitus without complications: Secondary | ICD-10-CM | POA: Insufficient documentation

## 2018-06-24 DIAGNOSIS — Z79899 Other long term (current) drug therapy: Secondary | ICD-10-CM | POA: Diagnosis not present

## 2018-06-24 DIAGNOSIS — Y999 Unspecified external cause status: Secondary | ICD-10-CM | POA: Diagnosis not present

## 2018-06-24 DIAGNOSIS — Z7984 Long term (current) use of oral hypoglycemic drugs: Secondary | ICD-10-CM | POA: Insufficient documentation

## 2018-06-24 DIAGNOSIS — F17220 Nicotine dependence, chewing tobacco, uncomplicated: Secondary | ICD-10-CM | POA: Insufficient documentation

## 2018-06-24 MED ORDER — FAMOTIDINE IN NACL 20-0.9 MG/50ML-% IV SOLN
20.0000 mg | Freq: Once | INTRAVENOUS | Status: AC
  Administered 2018-06-24: 20 mg via INTRAVENOUS
  Filled 2018-06-24: qty 50

## 2018-06-24 MED ORDER — PREDNISONE 20 MG PO TABS: 40.0000 mg | ORAL_TABLET | Freq: Every day | ORAL | 0 refills | Status: DC

## 2018-06-24 MED ORDER — EPINEPHRINE 0.3 MG/0.3ML IJ SOAJ: 0.3000 mg | Freq: Once | INTRAMUSCULAR | 1 refills | Status: AC

## 2018-06-24 NOTE — ED Provider Notes (Signed)
[facility] DEPT Provider Note   CSN: [id number] Arrival date & time: 06/24/18  1225     History   Chief Complaint Chief Complaint  Patient presents with  . Insect Bite    HPI Bruce Henry is a 32 y.o. male.  Patient is a 32 year old male with a history of obesity, asthma and prior allergies presenting today with an allergic reaction after a sting.  Patient is assuming it was a bee sting but he did not see the bee.  He was stung multiple times and quickly after started having swelling at the site, trouble breathing and feeling like his throat was closing.  EMS arrived and administered epi, Solu-Medrol and albuterol with significant improvement.  Upon arrival here patient states that other than some itching around the insect sting he feels back to normal.  He denies any shortness of breath or feeling of swelling in his throat.  Patient states he has had an allergic reaction like this to a bee sting about 8 years ago but does not carry an EpiPen.  The history is provided by the patient.  Allergic Reaction  Presenting symptoms: difficulty breathing, itching and rash   Severity:  Severe Duration:  10 minutes Prior allergic episodes:  Insect allergies Context: insect bite/sting   Relieved by:  Epinephrine Worsened by:  Nothing Ineffective treatments:  Antihistamines   Past Medical History:  Diagnosis Date  . Allergy    seasonal  . Asthma    Childhod   . Bell's palsy 2014  . Obesity     Patient Active Problem List   Diagnosis Date Noted  . Ventral hernia 08/09/2017  . Constipation 08/09/2017  . Hyperlipidemia 01/05/2015  . Diabetes mellitus, type II ([place]) 01/05/2015  . Morbid obesity ([place]) 01/03/2015  . Bell's palsy 05/22/2013    Past Surgical History:  Procedure Laterality Date  . ANAL FISSURE REPAIR  2007  . SHOULDER SURGERY  2003   left  . TONSILLECTOMY          Home Medications    Prior to Admission medications     Medication Sig Start Date End Date Taking? Authorizing Provider  Blood Glucose Monitoring Suppl (BLOOD GLUCOSE SYSTEM PAK) KIT Please dispense based on patient and insurance preference. Use to monitor FSBS 2x daily. Dx: E11.9. 08/12/17   Alycia Rossetti, MD  Glucose Blood (BLOOD GLUCOSE TEST STRIPS) STRP Please dispense based on patient and insurance preference. Use to monitor FSBS 2x daily. Dx: E11.9. 08/12/17   Alycia Rossetti, MD  Lancet Devices MISC Please dispense based on patient and insurance preference. Use to monitor FSBS 2x daily. Dx: E11.9. 08/12/17   Alycia Rossetti, MD  Lancets MISC Please dispense based on patient and insurance preference. Use to monitor FSBS 2x daily. Dx: E11.9. 08/12/17   Alycia Rossetti, MD  lisinopril (ZESTRIL) 2.5 MG tablet Take 1 tablet (2.5 mg total) by mouth daily. 02/14/18   Alycia Rossetti, MD  metFORMIN (GLUCOPHAGE) 500 MG tablet TAKE 1 TABLET BY MOUTH TWICE DAILY WITH A MEAL 12/16/17   Paoli, Modena Nunnery, MD  sildenafil (VIAGRA) 100 MG tablet Take 1 tablet (100 mg total) by mouth daily as needed for erectile dysfunction. 11/04/17   Alycia Rossetti, MD    Family History Family History  Problem Relation Age of Onset  . Cancer Mother        ovarian  . Diabetes Mother   . Diabetes Father   . Hypertension Father   .  Diabetes Paternal Grandmother   . Hyperlipidemia Paternal Grandmother   . Cancer Paternal Grandmother        Brain cancer  . Diabetes Brother   . Hypertension Brother   . Diabetes Maternal Grandmother   . Cancer Maternal Grandmother        Colon cancer  . Diabetes Maternal Grandfather   . Cancer Maternal Grandfather        pancreatic cancer  . Diabetes Paternal Uncle     Social History Social History   Tobacco Use  . Smoking status: Never Smoker  . Smokeless tobacco: Current User    Types: Snuff  Substance Use Topics  . Alcohol use: Yes    Alcohol/week: 2.0 standard drinks    Types: 2 Glasses of wine per week     Comment: occasionally  . Drug use: No     Allergies   Bee venom   Review of Systems Review of Systems  Skin: Positive for itching and rash.  All other systems reviewed and are negative.    Physical Exam Updated Vital Signs BP (!) 182/71   Pulse 80   Temp 98.4 F (36.9 C)   Resp 17   Ht '5\' 4"'$  (1.626 m)   Wt 124.7 kg   SpO2 99%   BMI 47.20 kg/m   Physical Exam  Constitutional: He is oriented to person, place, and time. He appears well-developed and well-nourished. No distress.  HENT:  Head: Normocephalic and atraumatic.  Mouth/Throat: Oropharynx is clear and moist.  No tongue or uvula edema  Eyes: Pupils are equal, round, and reactive to light. Conjunctivae and EOM are normal.  Neck: Normal range of motion. Neck supple.  Cardiovascular: Normal rate, regular rhythm and intact distal pulses.  No murmur heard. Pulmonary/Chest: Effort normal and breath sounds normal. No respiratory distress. He has no wheezes. He has no rales.  Abdominal: Soft. He exhibits no distension. There is no tenderness. There is no rebound and no guarding.  Musculoskeletal: Normal range of motion. He exhibits no edema or tenderness.  Neurological: He is alert and oriented to person, place, and time.  Skin: Skin is warm and dry. No rash noted. No erythema.     Psychiatric: He has a normal mood and affect. His behavior is normal.  Nursing note and vitals reviewed.    ED Treatments / Results  Labs (all labs ordered are listed, but only abnormal results are displayed) Labs Reviewed - No data to display  EKG EKG Interpretation  Date/Time:  Tuesday June 24 2018 12:39:43 EDT Ventricular Rate:  90 PR Interval:    QRS Duration: 104 QT Interval:  383 QTC Calculation: 469 R Axis:   11 Text Interpretation:  Sinus rhythm Probable left atrial enlargement Borderline prolonged QT interval No significant change since last tracing Confirmed by Blanchie Dessert [phone]) on 06/24/2018 12:52:00  PM   Radiology No results found.  Procedures Procedures (including critical care time)  Medications Ordered in ED Medications - No data to display   Initial Impression / Assessment and Plan / ED Course  I have reviewed the triage vital signs and the nursing notes.  Pertinent labs & imaging results that were available during my care of the patient were reviewed by me and considered in my medical decision making (see chart for details).     Patient presenting today after an anaphylactic reaction to bee stings.  He received epi, solumedrol, Benadryl and albuterol prior to arrival.  Here patient is asymptomatic.  He is well-appearing.  He has 2 small areas where he had an insect sting but no other evidence of anaphylaxis at this time.  Patient is not hypotensive.  Will observe the patient to ensure no recurrent reaction and will ensure that he is discharged home with an EpiPen.  Final Clinical Impressions(s) / ED Diagnoses   Final diagnoses:  Insect bite of right upper arm, initial encounter  Anaphylaxis, initial encounter    ED Discharge Orders    None       Blanchie Dessert, MD 06/24/18 1300

## 2018-06-24 NOTE — ED Notes (Signed)
Bed: RESA Expected date:  Expected time:  Means of arrival:  Comments: Insect bite- poss anaphylaxis

## 2018-06-24 NOTE — ED Triage Notes (Signed)
Pt believes he was bitten by something and chest began to feel tight and started having difficulty breathing. [facility]  Epi .5 50 of benadryl PO 2.5 albuterol 125 solumedrol 18g RAC

## 2018-06-24 NOTE — Discharge Instructions (Addendum)
Continue benadryl for the next 2-3 days every 6 hours.  Also take prednisone for the next 3 days (start tomorrow).

## 2022-03-07 ENCOUNTER — Emergency Department (HOSPITAL_COMMUNITY)
Admission: EM | Admit: 2022-03-07 | Discharge: 2022-03-08 | Disposition: A | Discharge status: Home / Self Care | Payer: 59 | Attending: Emergency Medicine | Admitting: Emergency Medicine

## 2022-03-07 ENCOUNTER — Emergency Department (HOSPITAL_COMMUNITY): Payer: 59

## 2022-03-07 ENCOUNTER — Encounter (HOSPITAL_COMMUNITY): Payer: Self-pay | Admitting: Emergency Medicine

## 2022-03-07 ENCOUNTER — Other Ambulatory Visit: Payer: Self-pay

## 2022-03-07 DIAGNOSIS — Z7984 Long term (current) use of oral hypoglycemic drugs: Secondary | ICD-10-CM | POA: Insufficient documentation

## 2022-03-07 DIAGNOSIS — E1165 Type 2 diabetes mellitus with hyperglycemia: Secondary | ICD-10-CM | POA: Diagnosis not present

## 2022-03-07 DIAGNOSIS — R519 Headache, unspecified: Secondary | ICD-10-CM | POA: Diagnosis present

## 2022-03-07 DIAGNOSIS — G44019 Episodic cluster headache, not intractable: Secondary | ICD-10-CM | POA: Insufficient documentation

## 2022-03-07 LAB — CBC WITH DIFFERENTIAL/PLATELET
Abs Immature Granulocytes: 0.02 10*3/uL (ref 0.00–0.07)
Basophils Absolute: 0 10*3/uL (ref 0.0–0.1)
Basophils Relative: 0 %
Eosinophils Absolute: 0.2 10*3/uL (ref 0.0–0.5)
Eosinophils Relative: 2 %
HCT: 42.6 % (ref 39.0–52.0)
Hemoglobin: 14.2 g/dL (ref 13.0–17.0)
Immature Granulocytes: 0 %
Lymphocytes Relative: 40 %
Lymphs Abs: 3.8 10*3/uL (ref 0.7–4.0)
MCH: 27.9 pg (ref 26.0–34.0)
MCHC: 33.3 g/dL (ref 30.0–36.0)
MCV: 83.7 fL (ref 80.0–100.0)
Monocytes Absolute: 0.7 10*3/uL (ref 0.1–1.0)
Monocytes Relative: 7 %
Neutro Abs: 4.7 10*3/uL (ref 1.7–7.7)
Neutrophils Relative %: 51 %
Platelets: 295 10*3/uL (ref 150–400)
RBC: 5.09 MIL/uL (ref 4.22–5.81)
RDW: 13 % (ref 11.5–15.5)
WBC: 9.4 10*3/uL (ref 4.0–10.5)
nRBC: 0 % (ref 0.0–0.2)

## 2022-03-07 LAB — BASIC METABOLIC PANEL
Anion gap: 11 (ref 5–15)
BUN: 5 mg/dL — ABNORMAL LOW (ref 6–20)
CO2: 23 mmol/L (ref 22–32)
Calcium: 9.6 mg/dL (ref 8.9–10.3)
Chloride: 98 mmol/L (ref 98–111)
Creatinine, Ser: 0.67 mg/dL (ref 0.61–1.24)
GFR, Estimated: >60 mL/min (ref >60)
Glucose, Bld: 367 mg/dL — ABNORMAL HIGH (ref 70–99)
Potassium: 4.1 mmol/L (ref 3.5–5.1)
Sodium: 132 mmol/L — ABNORMAL LOW (ref 135–145)

## 2022-03-07 NOTE — ED Triage Notes (Signed)
Pt c/oi headache behind the left eye x 2 weeks, left eye blurry vision and nausea x 2 days; pt reports feeling SOB starting today; has not seen PCP, no relief with Tylenol or Excedrin  ?

## 2022-03-07 NOTE — ED Provider Triage Note (Signed)
Emergency Medicine Provider Triage Evaluation Note ? ?Bruce Henry , a 36 y.o. male  was evaluated in triage.  Pt complains of headache, left eye pressure, blurry vision in left eye.  Patient states that he has had a headache on the left side of his head for the past 2 weeks.  States that over the past week he has felt pressure behind his left eye.  States that for the past 2 days he has had blurry vision in the left eye.  Denies fall, injury, loss of consciousness ? ?Review of Systems  ?Positive: Headache, blurry vision ?Negative: Loss of consciousness ? ?Physical Exam  ?Ht 5\' 5"  (1.651 m)   Wt 123.4 kg   BMI 45.26 kg/m?  ?Gen:   Awake, no distress   ?Resp:  Normal effort  ?MSK:   Moves extremities without difficulty  ?Other:   ? ?Medical Decision Making  ?Medically screening exam initiated at 8:18 PM.  Appropriate orders placed.  Bruce Henry was informed that the remainder of the evaluation will be completed by another provider, this initial triage assessment does not replace that evaluation, and the importance of remaining in the ED until their evaluation is complete. ? ? ?  ?Dorothyann Peng, PA-C ?03/07/22 2044 ? ?

## 2022-03-08 MED ORDER — PROCHLORPERAZINE EDISYLATE 10 MG/2ML IJ SOLN
10.0000 mg | Freq: Once | INTRAMUSCULAR | Status: AC
  Administered 2022-03-08: 10 mg via INTRAVENOUS
  Filled 2022-03-08: qty 2

## 2022-03-08 MED ORDER — KETOROLAC TROMETHAMINE 15 MG/ML IJ SOLN
15.0000 mg | Freq: Once | INTRAMUSCULAR | Status: AC
  Administered 2022-03-08: 15 mg via INTRAVENOUS
  Filled 2022-03-08: qty 1

## 2022-03-08 MED ORDER — LACTATED RINGERS IV BOLUS
1000.0000 mL | Freq: Once | INTRAVENOUS | Status: AC
  Administered 2022-03-08: 1000 mL via INTRAVENOUS

## 2022-03-08 NOTE — ED Notes (Signed)
Patient verbalizes understanding of discharge instructions. Opportunity for questioning and answers were provided. Armband removed by staff, pt discharged from ED. Ambulated out to lobby  

## 2022-03-08 NOTE — ED Provider Notes (Signed)
?[facility] ?Provider Note ? ? ?CSN: [id number] ?Arrival date & time: 03/07/22  1936 ? ?  ? ?History ? ?Chief Complaint  ?Patient presents with  ? Headache  ? ? ?Bruce Henry is a 36 y.o. male. ? ?The history is provided by the patient.  ?Headache ?Location: left frontal. ?Onset quality:  Gradual ?Duration:  2 weeks ?Timing:  Intermittent ?Progression:  Waxing and waning ?Chronicity:  New ?Relieved by:  Nothing ?Worsened by:  Nothing ?Associated symptoms: blurred vision, nausea and vomiting   ?Associated symptoms: no fever, no focal weakness, no neck stiffness, no numbness, no photophobia and no weakness   ?Risk factors: no family hx of SAH   ? ?Patient with history of diabetes and obesity presents with headache.  He reports the headache started gradually over 2 weeks ago.  It has been intermittent.  It is mostly on the left side of his head and is associated with blurred vision in the left eye, eye discharge and swelling around the eye.  No trauma or falls. ?No focal arm or leg weakness.  No fevers.  No international travel.  No tick bite or rash.  He is a Administrator and drives locally.  He is continue to work but still has a headache ?No previous history of stroke.  No family history of aneurysm ?There has  been no neck pain or stiffness reported ?Home Medications ?Prior to Admission medications   ?Medication Sig Start Date End Date Taking? Authorizing Provider  ?Blood Glucose Monitoring Suppl (BLOOD GLUCOSE SYSTEM PAK) KIT Please dispense based on patient and insurance preference. Use to monitor FSBS 2x daily. Dx: E11.9. 08/12/17   Alycia Rossetti, MD  ?diphenhydrAMINE (BENADRYL) 25 MG tablet Take 50 mg by mouth daily as needed for allergies (insect bite).    [provider]  ?Glucose Blood (BLOOD GLUCOSE TEST STRIPS) STRP Please dispense based on patient and insurance preference. Use to monitor FSBS 2x daily. Dx: E11.9. 08/12/17   Alycia Rossetti, MD  ?Lancet  Devices MISC Please dispense based on patient and insurance preference. Use to monitor FSBS 2x daily. Dx: E11.9. 08/12/17   Alycia Rossetti, MD  ?Lancets MISC Please dispense based on patient and insurance preference. Use to monitor FSBS 2x daily. Dx: E11.9. 08/12/17   Alycia Rossetti, MD  ?lisinopril (ZESTRIL) 2.5 MG tablet Take 1 tablet (2.5 mg total) by mouth daily. ?Patient not taking: Reported on 06/24/2018 02/14/18   Alycia Rossetti, MD  ?metFORMIN (GLUCOPHAGE) 500 MG tablet TAKE 1 TABLET BY MOUTH TWICE DAILY WITH A MEAL 12/16/17   Dixon, Modena Nunnery, MD  ?predniSONE (DELTASONE) 20 MG tablet Take 2 tablets (40 mg total) by mouth daily. 06/24/18   Blanchie Dessert, MD  ?sildenafil (VIAGRA) 100 MG tablet Take 1 tablet (100 mg total) by mouth daily as needed for erectile dysfunction. ?Patient not taking: Reported on 06/24/2018 11/04/17   Alycia Rossetti, MD  ?   ? ?Allergies    ?Bee venom   ? ?Review of Systems   ?Review of Systems  ?Constitutional:  Negative for fever.  ?Eyes:  Positive for blurred vision and discharge. Negative for photophobia.  ?Cardiovascular:  Negative for chest pain.  ?Gastrointestinal:  Positive for nausea and vomiting.  ?Musculoskeletal:  Negative for neck stiffness.  ?Neurological:  Positive for headaches. Negative for focal weakness, speech difficulty, weakness and numbness.  ? ?Physical Exam ?Updated Vital Signs ?BP 120/75 (BP Location: Right Arm)   Pulse 70  Temp 98.2 ?F (36.8 ?C) (Oral)   Resp 16   Ht 1.651 m ($Remove'5\' 5"'FuyrOWa$ )   Wt 123.4 kg   SpO2 99%   BMI 45.26 kg/m?  ?Physical Exam ?CONSTITUTIONAL: Well developed/well nourished ?HEAD: Normocephalic/atraumatic there is no rash to his forehead ?EYES: EOMI/PERRL, no nystagmus, mild ptosis on left, n funduscopic exam limited bilaterally but no obvious papilledema, no corneal haziness.  Visual acuity is noted ?ENMT: Mucous membranes moist ?NECK: supple no meningeal signs, no bruits ?SPINE/BACK:entire spine nontender ?CV: S1/S2 noted, no  murmurs/rubs/gallops noted ?LUNGS: Lungs are clear to auscultation bilaterally, no apparent distress ?ABDOMEN: soft, nontender, no rebound or guarding ?GU:no cva tenderness ?NEURO:Awake/alert, face symmetric, no arm or leg drift is noted ?Equal 5/5 strength with shoulder abduction, elbow flex/extension, wrist flex/extension in upper extremities and equal hand grips bilaterally ?Equal 5/5 strength with hip flexion,knee flex/extension, foot dorsi/plantar flexion ?Cranial nerves 3/4/5/6/05/13/09/11/12 tested and intact ?Gait normal without ataxia ?No past pointing ?Sensation to light touch intact in all extremities ?EXTREMITIES: pulses normal, full ROM ?SKIN: warm, color normal ?PSYCH: no abnormalities of mood noted, alert and oriented to situation ? ?ED Results / Procedures / Treatments   ?Labs ?(all labs ordered are listed, but only abnormal results are displayed) ?Labs Reviewed  ?BASIC METABOLIC PANEL - Abnormal; Notable for the following components:  ?    Result Value  ? Sodium 132 (*)   ? Glucose, Bld 367 (*)   ? BUN 5 (*)   ? All other components within normal limits  ?CBC WITH DIFFERENTIAL/PLATELET  ? ? ?EKG ?None ? ?Radiology ?CT Head Wo Contrast ? ?Result Date: 03/07/2022 ?CLINICAL DATA:  Headache. EXAM: CT HEAD WITHOUT CONTRAST TECHNIQUE: Contiguous axial images were obtained from the base of the skull through the vertex without intravenous contrast. RADIATION DOSE REDUCTION: This exam was performed according to the departmental dose-optimization program which includes automated exposure control, adjustment of the mA and/or kV according to patient size and/or use of iterative reconstruction technique. COMPARISON:  Head CT dated 02/19/2013. FINDINGS: Brain: The ventricles and sulci are appropriate size for the patient's age. The gray-white matter discrimination is preserved. There is no acute intracranial hemorrhage. No mass effect or midline shift. No extra-axial fluid collection. Vascular: No hyperdense vessel  or unexpected calcification. Skull: Normal. Negative for fracture or focal lesion. Sinuses/Orbits: No acute finding. Other: None IMPRESSION: No acute intracranial pathology. Electronically Signed   By: Anner Crete M.D.   On: 03/07/2022 21:46   ? ?Procedures ?Procedures  ? ? ?Medications Ordered in ED ?Medications  ?ketorolac (TORADOL) 15 MG/ML injection 15 mg (15 mg Intravenous Given 03/08/22 0440)  ?prochlorperazine (COMPAZINE) injection 10 mg (10 mg Intravenous Given 03/08/22 0441)  ?lactated ringers bolus 1,000 mL (1,000 mLs Intravenous New Bag/Given 03/08/22 0439)  ? ? ?ED Course/ Medical Decision Making/ A&P ?Clinical Course as of 03/08/22 0620  ?Thu Mar 08, 2022  ?0358 Glucose(!): 367 ?Hyperglycemia [DW]  ?0432 Patient overall well-appearing.  Strong suspicion that this is representing cluster headaches.  He has no focal neurodeficits.  He ambulates without difficulty. [DW]  ?0446 Patient reports he is in between primary care physicians because his PCP left the practice.  He will see his new PCP later this week [DW]  ?0620 Patient improved, he is ambulatory.  Will discharge home [DW]  ?  ?Clinical Course User Index ?[DW] Ripley Fraise, MD  ? ?                        ?  Medical Decision Making ?Amount and/or Complexity of Data Reviewed ?Labs:  Decision-making details documented in ED Course. ? ?Risk ?Prescription drug management. ? ? ?This patient presents to the ED for concern of headache, this involves an extensive number of treatment options, and is a complaint that carries with it a high risk of complications and morbidity.  The differential diagnosis includes but is not limited to subarachnoid hemorrhage, meningitis, migraine, idiopathic intracranial hypertension, cluster headache, CVST, brain tumor ? ?Comorbidities that complicate the patient evaluation: ?Patient?s presentation is complicated by their history of hypertension and obesity ? ?Social Determinants of Health: ?Patient?s impaired access to  primary care  increases the complexity of managing their presentation ? ? ?Lab Tests: ?I Ordered, and personally interpreted labs.  The pertinent results include: hyperGlycemia ? ?Imaging Studies ordered: ?I order

## 2022-03-09 ENCOUNTER — Encounter: Payer: Self-pay | Admitting: Family Medicine

## 2022-03-09 ENCOUNTER — Ambulatory Visit (INDEPENDENT_AMBULATORY_CARE_PROVIDER_SITE_OTHER): Payer: 59 | Admitting: Family Medicine

## 2022-03-09 VITALS — BP 147/97 | HR 81 | Temp 98.3°F | Ht 65.0 in | Wt 289.0 lb

## 2022-03-09 DIAGNOSIS — E1169 Type 2 diabetes mellitus with other specified complication: Secondary | ICD-10-CM

## 2022-03-09 DIAGNOSIS — G44019 Episodic cluster headache, not intractable: Secondary | ICD-10-CM

## 2022-03-09 DIAGNOSIS — Z7689 Persons encountering health services in other specified circumstances: Secondary | ICD-10-CM

## 2022-03-09 DIAGNOSIS — J302 Other seasonal allergic rhinitis: Secondary | ICD-10-CM

## 2022-03-09 DIAGNOSIS — Z87891 Personal history of nicotine dependence: Secondary | ICD-10-CM

## 2022-03-09 DIAGNOSIS — R030 Elevated blood-pressure reading, without diagnosis of hypertension: Secondary | ICD-10-CM

## 2022-03-09 DIAGNOSIS — K581 Irritable bowel syndrome with constipation: Secondary | ICD-10-CM | POA: Diagnosis not present

## 2022-03-09 LAB — MICROALBUMIN / CREATININE URINE RATIO
Creatinine,U: 68.9 mg/dL
Microalb Creat Ratio: 4.2 mg/g (ref 0.0–30.0)
Microalb, Ur: 2.9 mg/dL — ABNORMAL HIGH (ref 0.0–1.9)

## 2022-03-09 LAB — POCT GLYCOSYLATED HEMOGLOBIN (HGB A1C): Hemoglobin A1C: 13 % — AB (ref 4.0–5.6)

## 2022-03-09 MED ORDER — RIZATRIPTAN BENZOATE 5 MG PO TABS: 5.0000 mg | ORAL_TABLET | ORAL | 0 refills | Status: DC | PRN

## 2022-03-09 MED ORDER — OZEMPIC (0.25 OR 0.5 MG/DOSE) 2 MG/1.5ML ~~LOC~~ SOPN: 0.2500 mg | PEN_INJECTOR | SUBCUTANEOUS | 1 refills | Status: DC

## 2022-03-09 MED ORDER — METFORMIN HCL 500 MG PO TABS: 500.0000 mg | ORAL_TABLET | Freq: Two times a day (BID) | ORAL | 3 refills | Status: DC

## 2022-03-09 NOTE — Progress Notes (Signed)
? ? ?Patient presents to clinic today to establish care and follow-up on chronic conditions.. ? ?SUBJECTIVE: ?PMH: ?Patient is a 36 year old male with pmh sig for DM II, seasonal allergies, IBS-C who was previously seen by Dr. Vic Blackbird.  Last seen 2-3 yrs ago. ? ?DM  II: ?-Previously on metformin. ?-Off meds few years ?-Was checking blood sugar few weeks ago when ran out of strips.  Patient does not recall name of meter. ?-Patient states he is not eating fast food ?-Drinking water and Gatorade ? ?IBS-C: ?-Previously on Linzess a few years ago ?-Relaxin other OTC products ineffective ?-Has not seen GI. ? ?Recent headaches: ?-Patient endorses daily headaches x2 weeks. ?-Seen in ED for episodic cluster headaches on 03/07/2022.  Given Compazine, LR , Toradol ?-CT head without contrast on 03/07/2022 negative for acute intercranial pathology. ?-Advised to follow-up with [facility] ?-Headaches occur on the left side of face, vision blurred in left eye, nausea, vomiting.  ?-If standing will get nausea and feel off balance before the headache starts.  If laying down does not have the symptoms. ?-Tried Excedrin, Tylenol, Motrin for symptoms without relief.  Caffeine seems to help. ?-Patient denies issues with sleep or snoring at night unless really tired. ? ?Tobacco use: ?-Patient quit using dip 1 month ago.  Was using 1 large can every 2 days. ?-Patient used dip from 2009- April 2023. ? ?Seasonal allergies: ?-Taking OTC Zyrtec as needed ?-Typically starts taking medication in the spring, March through May. ?-Also using Afrin nasal spray daily. ?-Feels like medication is not working as well as it used to. ?-Notes increased symptoms this week after [place] the zoo. ? ? ?Allergies: NKDA ? ?Social history: ?Patient works as a Administrator.  He has 2 daughters ages 48 and 25.  Patient denies alcohol drug use.  Patient quit using dip 1 month ago. ? ?Health Maintenance: ?Dental --no recent exam ?Vision --2019 ?Immunizations  -- ?Colonoscopy --not indicated 2/2 age ? ?Family medical history: ?Mom-HTN ?Dad-HTN, DM ?Younger Brother-HTN ? ? ?Past Medical History:  ?Diagnosis Date  ? Allergy   ? seasonal  ? Asthma   ? Childhod   ? Bell's palsy 2014  ? Obesity   ? ? ?Past Surgical History:  ?Procedure Laterality Date  ? ANAL FISSURE REPAIR  2007  ? SHOULDER SURGERY  2003  ? left  ? TONSILLECTOMY    ? ? ?Current Outpatient Medications on File Prior to Visit  ?Medication Sig Dispense Refill  ? Blood Glucose Monitoring Suppl (BLOOD GLUCOSE SYSTEM PAK) KIT Please dispense based on patient and insurance preference. Use to monitor FSBS 2x daily. Dx: E11.9. 1 each 1  ? diphenhydrAMINE (BENADRYL) 25 MG tablet Take 50 mg by mouth daily as needed for allergies (insect bite).    ? Glucose Blood (BLOOD GLUCOSE TEST STRIPS) STRP Please dispense based on patient and insurance preference. Use to monitor FSBS 2x daily. Dx: E11.9. 100 each 1  ? Lancet Devices MISC Please dispense based on patient and insurance preference. Use to monitor FSBS 2x daily. Dx: E11.9. 1 each 1  ? Lancets MISC Please dispense based on patient and insurance preference. Use to monitor FSBS 2x daily. Dx: E11.9. 100 each 1  ? metFORMIN (GLUCOPHAGE) 500 MG tablet TAKE 1 TABLET BY MOUTH TWICE DAILY WITH A MEAL 180 tablet 3  ? predniSONE (DELTASONE) 20 MG tablet Take 2 tablets (40 mg total) by mouth daily. 6 tablet 0  ? lisinopril (ZESTRIL) 2.5 MG tablet Take 1 tablet (2.5 mg total)  by mouth daily. (Patient not taking: Reported on 06/24/2018) 30 tablet 3  ? sildenafil (VIAGRA) 100 MG tablet Take 1 tablet (100 mg total) by mouth daily as needed for erectile dysfunction. (Patient not taking: Reported on 06/24/2018) 6 tablet 1  ? ?No current facility-administered medications on file prior to visit.  ? ? ?Allergies  ?Allergen Reactions  ? Bee Venom   ? ? ?Family History  ?Problem Relation Age of Onset  ? Cancer Mother   ?     ovarian  ? Diabetes Mother   ? Diabetes Father   ? Hypertension Father    ? Diabetes Paternal Grandmother   ? Hyperlipidemia Paternal Grandmother   ? Cancer Paternal Grandmother   ?     Brain cancer  ? Diabetes Brother   ? Hypertension Brother   ? Diabetes Maternal Grandmother   ? Cancer Maternal Grandmother   ?     Colon cancer  ? Diabetes Maternal Grandfather   ? Cancer Maternal Grandfather   ?     pancreatic cancer  ? Diabetes Paternal Uncle   ? ? ?Social History  ? ?Socioeconomic History  ? Marital status: Single  ?  Spouse name: Not on file  ? Number of children: Not on file  ? Years of education: Not on file  ? Highest education level: Not on file  ?Occupational History  ? Not on file  ?Tobacco Use  ? Smoking status: Never  ? Smokeless tobacco: Former  ?  Types: Snuff  ?Substance and Sexual Activity  ? Alcohol use: Yes  ?  Alcohol/week: 2.0 standard drinks  ?  Types: 2 Glasses of wine per week  ?  Comment: occasionally  ? Drug use: No  ? Sexual activity: Yes  ?Other Topics Concern  ? Not on file  ?Social History Narrative  ? Not on file  ? ?Social Determinants of Health  ? ?Financial Resource Strain: Not on file  ?Food Insecurity: Not on file  ?Transportation Needs: Not on file  ?Physical Activity: Not on file  ?Stress: Not on file  ?Social Connections: Not on file  ?Intimate Partner Violence: Not on file  ? ? ?ROS ?General: Denies fever, chills, night sweats, changes in weight, changes in appetite ?HEENT: Denies headaches, ear pain, changes in vision, rhinorrhea, sore throat + headaches, allergy symptoms-nasal congesiton ?CV: Denies CP, palpitations, SOB, orthopnea ?Pulm: Denies SOB, cough, wheezing ?GI: Denies abdominal pain, nausea, vomiting, diarrhea + constipation ?GU: Denies dysuria, hematuria, frequency ?Msk: Denies muscle cramps, joint pains ?Neuro: Denies weakness, numbness, tingling ?Skin: Denies rashes, bruising ?Psych: Denies depression, anxiety, hallucinations ? ?BP (!) 149/95 (BP Location: Left Arm, Patient Position: Sitting, Cuff Size: Normal)   Pulse 81   Temp  98.3 ?F (36.8 ?C) (Oral)   Ht _0  (1.651 m)   Wt 289 lb (131.1 kg)   SpO2 98%   BMI 48.09 kg/m?  ? ?Physical Exam ?Gen. Pleasant, well developed, well-nourished, in NAD ?HEENT - [place]/AT, PERRL, EOMI, conjunctive clear, no scleral icterus, no nasal drainage, pharynx without erythema or exudate.  TMs normal bilaterally. ?Neck: No JVD, no thyromegaly, no carotid bruits ?Lungs: no use of accessory muscles, CTAB, no wheezes, rales or rhonchi ?Cardiovascular: RRR,  No r/g/m, no peripheral edema ?Abdomen: BS present, soft, nontender, nondistended ?Musculoskeletal: No deformities, moves all four extremities, no cyanosis or clubbing, normal tone ?Neuro:  A&Ox3, CN II-XII intact, normal gait ?Skin:  Warm, dry, intact, no lesions.  Acanthosis nigricans ?Psych: normal affect, mood appropriate ? ?Recent  Results (from the past 2160 hour(s))  ?Basic metabolic panel     Status: Abnormal  ? Collection Time: 03/07/22  8:22 PM  ?Result Value Ref Range  ? Sodium 132 (L) 135 - 145 mmol/L  ? Potassium 4.1 3.5 - 5.1 mmol/L  ? Chloride 98 98 - 111 mmol/L  ? CO2 23 22 - 32 mmol/L  ? Glucose, Bld 367 (H) 70 - 99 mg/dL  ?  Comment: Glucose reference range applies only to samples taken after fasting for at least 8 hours.  ? BUN 5 (L) 6 - 20 mg/dL  ? Creatinine, Ser 0.67 0.61 - 1.24 mg/dL  ? Calcium 9.6 8.9 - 10.3 mg/dL  ? GFR, Estimated >60 >60 mL/min  ?  Comment: (NOTE) ?Calculated using the CKD-EPI Creatinine Equation (2021) ?  ? Anion gap 11 5 - 15  ?  Comment: Performed at [facility], [place] [address], [place], [place] [address]  ?CBC with Differential     Status: None  ? Collection Time: 03/07/22  8:22 PM  ?Result Value Ref Range  ? WBC 9.4 4.0 - 10.5 K/uL  ? RBC 5.09 4.22 - 5.81 MIL/uL  ? Hemoglobin 14.2 13.0 - 17.0 g/dL  ? HCT 42.6 39.0 - 52.0 %  ? MCV 83.7 80.0 - 100.0 fL  ? MCH 27.9 26.0 - 34.0 pg  ? MCHC 33.3 30.0 - 36.0 g/dL  ? RDW 13.0 11.5 - 15.5 %  ? Platelets 295 150 - 400 K/uL  ? nRBC 0.0 0.0 - 0.2 %  ? Neutrophils  Relative % 51 %  ? Neutro Abs 4.7 1.7 - 7.7 K/uL  ? Lymphocytes Relative 40 %  ? Lymphs Abs 3.8 0.7 - 4.0 K/uL  ? Monocytes Relative 7 %  ? Monocytes Absolute 0.7 0.1 - 1.0 K/uL  ? Eosinophils Relative 2 %  ? Eosinophils

## 2022-03-09 NOTE — Patient Instructions (Addendum)
Your hemoglobin A1c was 13.0% this visit.  Is important that you restart your medications.  A prescription for metformin was sent to your pharmacy along with a prescription for Ozempic.  Ozempic is a weekly injection that helps with diabetes and weight loss.  We will have you come back in 1 month as we will need to make adjustments to your medication regimen. ?

## 2022-03-14 NOTE — Progress Notes (Signed)
? ?Referring:  ?Billie Ruddy, MD ?[address] ?[place],  [place] [address] ? ?PCP: ?Billie Ruddy, MD ? ?Neurology was asked to evaluate Bruce Henry, a 36 year old male for a chief complaint of headaches.  Our recommendations of care will be communicated by shared medical record.   ? ?CC:  headaches ? ?History provided from self ? ?HPI:  ?Medical co-morbidities: DM2, Left Bell's Palsy (2014), HLD ? ?The patient presents for evaluation of daily headaches which began 3 weeks ago. States he just woke up one day with a headache, no clear trigger for its onset. He has never had headaches like this before. Headache is described as left retro-orbital throbbing pain which radiates toward his occiput. States there is always some level of pain but he will have severe exacerbations daily with associated photophobia, nausea, and vomiting. He also reports ipsilateral eye droopiness, lacrimation, and nasal congestion. Severe pain can last for several hours at a time. He does get some relief from Goody's powder. Tried Maxalt and Excedrin which were ineffective. ? ?He also endorses pulsatile tinnitus and double vision when he looks to his left.  ? ?He presented to the ED 03/07/22 where [facility] was unremarkable. ? ?Headache History: ?Onset: 3 weeks ago ?Triggers: none ?Aura: double vision ?Location: left eye radiating to his occiput ?Quality/Description: throbbing ?Associated Symptoms: ? Photophobia: yes ? Phonophobia: no ? Nausea: yes ?Vomiting: yes ?Ipsilateral lacrimation, droopiness of left eye, nasal congestion ?Worse with activity?: yes ?Duration of headaches: hours ? ?Headache days per month: 21 ?Headache free days per month: 9 ? ?Current Treatment: ?Abortive ?Celada ? ?Preventative ?none ? ?Prior Therapies                                 ?Maxalt 5 mg PRN ?Lisinopril 2.5 mg daily ?Excedrin ?Goody Powders ? ?LABS: ?CBC ?   ?Component Value Date/Time  ? WBC 9.4 03/07/2022 2022  ? RBC 5.09 03/07/2022 2022  ? HGB  14.2 03/07/2022 2022  ? HCT 42.6 03/07/2022 2022  ? PLT 295 03/07/2022 2022  ? MCV 83.7 03/07/2022 2022  ? MCH 27.9 03/07/2022 2022  ? MCHC 33.3 03/07/2022 2022  ? RDW 13.0 03/07/2022 2022  ? LYMPHSABS 3.8 03/07/2022 2022  ? MONOABS 0.7 03/07/2022 2022  ? EOSABS 0.2 03/07/2022 2022  ? BASOSABS 0.0 03/07/2022 2022  ? ? ?  Latest Ref Rng & Units 03/07/2022  ?  8:22 PM 12/16/2017  ?  4:03 PM 11/04/2017  ?  3:53 PM  ?CMP  ?Glucose 70 - 99 mg/dL 367   243   309    ?BUN 6 - 20 mg/dL $Remove'5   10   8    'ABQmseX$ ?Creatinine 0.61 - 1.24 mg/dL 0.67   0.80   0.81    ?Sodium 135 - 145 mmol/L 132   136   136    ?Potassium 3.5 - 5.1 mmol/L 4.1   3.9   4.1    ?Chloride 98 - 111 mmol/L 98   100   101    ?CO2 22 - 32 mmol/L $RemoveB'23   27   25    'hLyOZOyh$ ?Calcium 8.9 - 10.3 mg/dL 9.6   9.3   9.4    ?Total Protein 6.1 - 8.1 g/dL  7.3     ?Total Bilirubin 0.2 - 1.2 mg/dL  0.4     ?AST 10 - 40 U/L  20     ?ALT  9 - 46 U/L  24     ? ? ? ?IMAGING:  ?[place] 03/07/22: unremarkable ? ?Imaging independently reviewed on Mar 15, 2022  ? ?Current Outpatient Medications on File Prior to Visit  ?Medication Sig Dispense Refill  ? Blood Glucose Monitoring Suppl (BLOOD GLUCOSE SYSTEM PAK) KIT Please dispense based on patient and insurance preference. Use to monitor FSBS 2x daily. Dx: E11.9. 1 each 1  ? diphenhydrAMINE (BENADRYL) 25 MG tablet Take 50 mg by mouth daily as needed for allergies (insect bite).    ? Glucose Blood (BLOOD GLUCOSE TEST STRIPS) STRP Please dispense based on patient and insurance preference. Use to monitor FSBS 2x daily. Dx: E11.9. 100 each 1  ? Lancet Devices MISC Please dispense based on patient and insurance preference. Use to monitor FSBS 2x daily. Dx: E11.9. 1 each 1  ? Lancets MISC Please dispense based on patient and insurance preference. Use to monitor FSBS 2x daily. Dx: E11.9. 100 each 1  ? metFORMIN (GLUCOPHAGE) 500 MG tablet Take 1 tablet (500 mg total) by mouth 2 (two) times daily with a meal. 180 tablet 3  ? rizatriptan (MAXALT) 5 MG tablet Take 1  tablet (5 mg total) by mouth as needed for migraine. May repeat in 2 hours if needed 10 tablet 0  ? Semaglutide,0.25 or 0.5MG /DOS, (OZEMPIC, 0.25 OR 0.5 MG/DOSE,) 2 MG/1.5ML SOPN Inject 0.25 mg into the skin once a week. 1.5 mL 1  ? ?No current facility-administered medications on file prior to visit.  ? ? ? ?Allergies: ?Allergies  ?Allergen Reactions  ? Bee Venom   ? ? ?Family History: ?Migraine or other headaches in the family:  no ?Aneurysms in a first degree relative:  no ?Brain tumors in the family:  no ?Other neurological illness in the family:   no ? ?Past Medical History: ?Past Medical History:  ?Diagnosis Date  ? Allergy   ? seasonal  ? Asthma   ? Childhod   ? Bell's palsy 11/05/2012  ? Headache   ? Obesity   ? ? ?Past Surgical History ?Past Surgical History:  ?Procedure Laterality Date  ? ANAL FISSURE REPAIR  2007  ? SHOULDER SURGERY  2003  ? left  ? TONSILLECTOMY    ? ? ?Social History: ?Social History  ? ?Tobacco Use  ? Smoking status: Never  ? Smokeless tobacco: Former  ?  Types: Snuff  ?Substance Use Topics  ? Alcohol use: Yes  ?  Alcohol/week: 2.0 standard drinks  ?  Types: 2 Glasses of wine per week  ?  Comment: occasionally  ? Drug use: No  ? ? ?ROS: ?Negative for fevers, chills. Positive for headaches. All other systems reviewed and negative unless stated otherwise in HPI. ? ? ?Physical Exam:  ? ?Vital Signs: ?BP (!) 154/96   Pulse 92   Ht 5\' 4"  (1.626 m)   Wt 287 lb (130.2 kg)   SpO2 96%   BMI 49.26 kg/m?  ?GENERAL: well appearing,in no acute distress,alert ?SKIN:  Color, texture, turgor normal. No rashes or lesions ?HEAD:  Normocephalic/atraumatic. ?CV:  RRR ?RESP: Normal respiratory effort ?MSK: +tenderness to palpation over left occiput ? ?NEUROLOGICAL: ?Mental Status: Alert, oriented to person, place and time,Follows commands ?Cranial Nerves: PERRL, fundi poorly visualized, visual fields intact to confrontation, extraocular movements intact, diminished sensation over left V1-2, +left eye  ptosis, hearing grossly intact, no dysarthria ?Motor: muscle strength 5/5 both upper and lower extremities ?Reflexes: 2+ throughout ?Sensation: intact to light touch all 4 extremities ?Coordination: Finger-to-  nose-finger intact bilaterally ?Gait: normal-based ? ? ?IMPRESSION: ?36 year old male with a history of DM2, Left Bell's Palsy (2014), HLD who presents for evaluation of new left-sided headaches and double vision for the past 3 weeks. His exam is significant for a left sixth nerve palsy and decreased sensation over left V1-2. MRI/MRV ordered to rule out underlying structural cause including CVST. Fundi poorly visualized today due to pupillary constriction. Will refer to Ophthalmology for a formal fundus exam to assess for papilledema. In the meantime will plan for indomethacin trial to rule out hemicrania continua as he does report unilateral constant headache with ipsilateral autonomic symptoms. Counseled to present to ED if he experiences loss of vision or sudden worsening of headache. ? ?PLAN: ?-Stat MRI/MRV ?-Referral to Ophthalmology for formal fundus exam ?-Indomethacin trial: 25 mg TID x5 days, then 50 mg TID x5 days, then 75 mg TID x5 days ? ? ?I spent a total of 33 minutes chart reviewing and counseling the patient. Headache education was done. Discussed medication side effects, adverse reactions and drug interactions. Written educational materials and patient instructions outlining all of the above were given. ? ?Follow-up: 4 months or sooner if needed ? ? ?Genia Harold, MD ?03/15/2022   ?2:46 PM ? ? ?

## 2022-03-15 ENCOUNTER — Telehealth: Payer: Self-pay | Admitting: Psychiatry

## 2022-03-15 ENCOUNTER — Encounter: Payer: Self-pay | Admitting: Psychiatry

## 2022-03-15 ENCOUNTER — Ambulatory Visit (INDEPENDENT_AMBULATORY_CARE_PROVIDER_SITE_OTHER): Payer: 59 | Admitting: Psychiatry

## 2022-03-15 VITALS — BP 154/96 | HR 92 | Ht 64.0 in | Wt 287.0 lb

## 2022-03-15 DIAGNOSIS — R51 Headache with orthostatic component, not elsewhere classified: Secondary | ICD-10-CM | POA: Diagnosis not present

## 2022-03-15 DIAGNOSIS — G4451 Hemicrania continua: Secondary | ICD-10-CM | POA: Diagnosis not present

## 2022-03-15 DIAGNOSIS — R519 Headache, unspecified: Secondary | ICD-10-CM

## 2022-03-15 DIAGNOSIS — H532 Diplopia: Secondary | ICD-10-CM

## 2022-03-15 MED ORDER — INDOMETHACIN 25 MG PO CAPS: ORAL_CAPSULE | ORAL | 0 refills | Status: AC

## 2022-03-15 NOTE — Telephone Encounter (Signed)
For MRI Patient declined sooner appt, patient is scheduled for 03/27/2022 ? ? ?For CT scan he was advised he could walk in on 03/16/22. ?

## 2022-03-15 NOTE — Addendum Note (Signed)
Addended by: Ocie Doyne on: 03/15/2022 03:36 PM ? ? Modules accepted: Orders ? ?

## 2022-03-15 NOTE — Telephone Encounter (Signed)
[employer] Auth: [id number] exp. 03/15/22-04/29/22 sent to GI ?[employer] Josem [id number] exp. 03/15/22-04/29/22 sent to GI ? ?

## 2022-03-15 NOTE — Patient Instructions (Addendum)
MRI and MRV of the brain ? ?2. Referral to ophthalmology for eye exam ? ?3. Indomethacin trial to rule out hemicrania continua ?Week  1:  ?-Please obtain generic omeprazole (20 mg) [[employer], [employer], [employer], etc] and start one daily. You may increase to twice a day if necessary. ?-Please start indomethacin (indocin) 25 mg (one capsule) 3 times per day with meals. ?-If, in one week, you are headache-free, this is your dose, stay on it. ?-If, in one week, you are tolerating the indocin, but have headache, then increase the dose as below. ?Week 2: ?-Please increase indocin to 50 mg (2 capsules) 3 times per day with meals. ?-If, in one week, you are headache-free, this is your dose, stay on it. ?-If, in one week, you are tolerating the indocin, but have headache, then increase the dose as below. ?Weeks 3 :  ?-Please increase indocin to 75 mg (3 capsules) 3 times per day with meals. ?-If, in one week, you are headache-free, this is your dose, stay on it. ?-If, in one week, you have partial relief, call or see her neurologist. ?-If, in one week, you have no relief, stop the indocin. ? ?

## 2022-03-17 ENCOUNTER — Other Ambulatory Visit: Payer: 59

## 2022-03-19 ENCOUNTER — Telehealth: Payer: Self-pay | Admitting: Psychiatry

## 2022-03-19 NOTE — Telephone Encounter (Signed)
Sent to Dr. Groat ph # [phone] 

## 2022-03-27 ENCOUNTER — Ambulatory Visit
Admission: RE | Admit: 2022-03-27 | Discharge: 2022-03-27 | Disposition: A | Discharge status: Home / Self Care | Payer: 59 | Source: Ambulatory Visit | Attending: Psychiatry | Admitting: Psychiatry

## 2022-03-27 DIAGNOSIS — R51 Headache with orthostatic component, not elsewhere classified: Secondary | ICD-10-CM

## 2022-03-27 DIAGNOSIS — H532 Diplopia: Secondary | ICD-10-CM | POA: Diagnosis not present

## 2022-03-27 MED ORDER — GADOBENATE DIMEGLUMINE 529 MG/ML IV SOLN
20.0000 mL | Freq: Once | INTRAVENOUS | Status: AC | PRN
  Administered 2022-03-27: 20 mL via INTRAVENOUS

## 2022-04-09 ENCOUNTER — Ambulatory Visit: Payer: 59 | Admitting: Family Medicine

## 2022-04-19 ENCOUNTER — Ambulatory Visit (INDEPENDENT_AMBULATORY_CARE_PROVIDER_SITE_OTHER): Payer: 59 | Admitting: Family Medicine

## 2022-04-19 ENCOUNTER — Encounter: Payer: Self-pay | Admitting: Family Medicine

## 2022-04-19 VITALS — BP 144/92 | HR 86 | Temp 98.2°F | Ht 64.0 in | Wt 293.6 lb

## 2022-04-19 DIAGNOSIS — G8929 Other chronic pain: Secondary | ICD-10-CM

## 2022-04-19 DIAGNOSIS — R519 Headache, unspecified: Secondary | ICD-10-CM | POA: Diagnosis not present

## 2022-04-19 DIAGNOSIS — E1169 Type 2 diabetes mellitus with other specified complication: Secondary | ICD-10-CM | POA: Diagnosis not present

## 2022-04-19 DIAGNOSIS — H532 Diplopia: Secondary | ICD-10-CM | POA: Diagnosis not present

## 2022-04-19 DIAGNOSIS — I10 Essential (primary) hypertension: Secondary | ICD-10-CM | POA: Diagnosis not present

## 2022-04-19 LAB — POCT GLYCOSYLATED HEMOGLOBIN (HGB A1C): Hemoglobin A1C: 11.9 % — AB (ref 4.0–5.6)

## 2022-04-19 MED ORDER — OZEMPIC (0.25 OR 0.5 MG/DOSE) 2 MG/3ML ~~LOC~~ SOPN: 0.5000 mg | PEN_INJECTOR | SUBCUTANEOUS | 2 refills | Status: DC

## 2022-04-19 MED ORDER — LISINOPRIL 5 MG PO TABS: 5.0000 mg | ORAL_TABLET | Freq: Every day | ORAL | 3 refills | Status: DC

## 2022-04-19 MED ORDER — METFORMIN HCL 1000 MG PO TABS: 1000.0000 mg | ORAL_TABLET | Freq: Two times a day (BID) | ORAL | 3 refills | Status: DC

## 2022-04-19 NOTE — Progress Notes (Incomplete)
Subjective:    Patient ID: Bruce Henry, male    DOB: 11-Feb-1986, 36 y.o.   MRN: [id number]  Chief Complaint  Patient presents with  . Follow-up    HPI Patient was seen today for f/u.  Pt states he is working on making changes to diet since last OFV.  Still eating once per day, but planning on having smoothies in am.  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  States bs has improved, but is still elevated.  Highest 170.  Pt notes blurred vision when looking to the right.  States tried calling Ophthalmology office back, but has not heard anything else.  Pt also seen by Neurology.  States a med was called in but when he went to get it the pharmacy did not have it.  Past Medical History:  Diagnosis Date  . Allergy    seasonal  . Asthma    Childhod   . Bell's palsy 11/05/2012  . Headache   . Obesity     Allergies  Allergen Reactions  . Bee Venom     ROS General: Denies fever, chills, night sweats, changes in weight, changes in appetite HEENT: Denies headaches, ear pain, changes in vision, rhinorrhea, sore throat CV: Denies CP, palpitations, SOB, orthopnea Pulm: Denies SOB, cough, wheezing GI: Denies abdominal pain, nausea, vomiting, diarrhea, constipation GU: Denies dysuria, hematuria, frequency, vaginal discharge Msk: Denies muscle cramps, joint pains Neuro: Denies weakness, numbness, tingling Skin: Denies rashes, bruising Psych: Denies depression, anxiety, hallucinations      Objective:    Blood pressure (!) 144/92, pulse 86, temperature 98.2 F (36.8 C), temperature source Oral, height 5\' 4"  (1.626 m), weight 293 lb 9.6 oz (133.2 kg), SpO2 98 %.  Vision Screening   Right eye Left eye Both eyes  Without correction     With correction 20/25 20/25 20/25      Gen. Pleasant, well-nourished, in no distress, normal affect  *** HEENT: [place]/AT, face symmetric, conjunctiva clear, no scleral icterus, PERRLA, EOMI, nares patent without  drainage, pharynx without erythema or exudate. Neck: No JVD, no thyromegaly, no carotid bruits Lungs: no accessory muscle use, CTAB, no wheezes or rales Cardiovascular: RRR, no m/r/g, no ***peripheral edema Abdomen: BS present, soft, NT/ND, no hepatosplenomegaly. Musculoskeletal: No deformities, no cyanosis or clubbing, normal tone Neuro:  A&Ox3, CN II-XII intact, normal gait Skin:  Warm, no lesions/ rash   Wt Readings from Last 3 Encounters:  04/19/22 293 lb 9.6 oz (133.2 kg)  03/15/22 287 lb (130.2 kg)  03/09/22 289 lb (131.1 kg)    Lab Results  Component Value Date   WBC 9.4 03/07/2022   HGB 14.2 03/07/2022   HCT 42.6 03/07/2022   PLT 295 03/07/2022   GLUCOSE 367 (H) 03/07/2022   CHOL 249 (H) 12/16/2017   TRIG 396 (H) 12/16/2017   HDL 39 (L) 12/16/2017   LDLCALC 149 (H) 12/16/2017   ALT 24 12/16/2017   AST 20 12/16/2017   NA 132 (L) 03/07/2022   K 4.1 03/07/2022   CL 98 03/07/2022   CREATININE 0.67 03/07/2022   BUN 5 (L) 03/07/2022   CO2 23 03/07/2022   HGBA1C 13.0 (A) 03/09/2022   MICROALBUR 2.9 (H) 03/09/2022    Assessment/Plan:  No diagnosis found.  F/u ***  05/09/2022, MD

## 2022-04-19 NOTE — Patient Instructions (Addendum)
Hemoglobin A1c this visit is now 11.9%.  It was 13% on 03/09/2022.  A new prescription for metformin 1000 mg twice a day was sent to your pharmacy.  A new prescription for Ozempic 0.5 mg weekly was also sent to the pharmacy.  It appears the pharmacy received the prescription for indomethacin as written by your neurologist however it looks like it has been canceled and needs to be resent.  Contact neurology office regarding this.  Given your elevation in blood pressure a prescription for lisinopril 5 mg was sent to your pharmacy.  This will help your blood pressure as well is help protect your kidneys from damage due to diabetes.

## 2022-05-18 ENCOUNTER — Ambulatory Visit: Payer: 59 | Admitting: Family Medicine

## 2022-06-01 ENCOUNTER — Ambulatory Visit: Payer: 59 | Admitting: Family Medicine

## 2022-07-12 ENCOUNTER — Ambulatory Visit: Payer: 59 | Admitting: Family Medicine

## 2022-07-13 ENCOUNTER — Encounter: Payer: Self-pay | Admitting: Family Medicine

## 2022-07-13 ENCOUNTER — Ambulatory Visit (INDEPENDENT_AMBULATORY_CARE_PROVIDER_SITE_OTHER): Payer: 59 | Admitting: Family Medicine

## 2022-07-13 VITALS — BP 126/82 | HR 87 | Temp 98.6°F | Wt 290.0 lb

## 2022-07-13 DIAGNOSIS — K219 Gastro-esophageal reflux disease without esophagitis: Secondary | ICD-10-CM

## 2022-07-13 DIAGNOSIS — K581 Irritable bowel syndrome with constipation: Secondary | ICD-10-CM

## 2022-07-13 DIAGNOSIS — E1169 Type 2 diabetes mellitus with other specified complication: Secondary | ICD-10-CM

## 2022-07-13 LAB — POCT GLYCOSYLATED HEMOGLOBIN (HGB A1C): Hemoglobin A1C: 8.3 % — AB (ref 4.0–5.6)

## 2022-07-13 MED ORDER — LINACLOTIDE 145 MCG PO CAPS: 145.0000 ug | ORAL_CAPSULE | Freq: Every day | ORAL | 3 refills | Status: DC

## 2022-07-13 MED ORDER — ESOMEPRAZOLE MAGNESIUM 20 MG PO CPDR: 20.0000 mg | DELAYED_RELEASE_CAPSULE | Freq: Every day | ORAL | 1 refills | Status: DC

## 2022-07-13 MED ORDER — SEMAGLUTIDE (1 MG/DOSE) 4 MG/3ML ~~LOC~~ SOPN: 1.0000 mg | PEN_INJECTOR | SUBCUTANEOUS | 5 refills | Status: DC

## 2022-07-13 NOTE — Patient Instructions (Addendum)
A prescription for Linzess was sent to your pharmacy.  A prescription for Nexium was also sent to the pharmacy.  For continued or worsening symptoms we can place a referral for you to see the gastroenterologist.  Your hemoglobin A1c is now 8.3%.  It was 11.9% on 04/19/2022.  Ideally goal would be under 7%.  Given this we will increase the Ozempic to 1 mg per week.  Keep up the good work.

## 2022-07-13 NOTE — Progress Notes (Signed)
Subjective:    Patient ID: Bruce Henry, male    DOB: 1986-07-16, 36 y.o.   MRN: [id number]  Chief Complaint  Patient presents with   Follow-up    Dm  Indigestion just started all of a sudden about 3 weeks ago. Nexium has been helping.  Was diagnosed with IBS years ago and was taking linzess, but started getting expensive and stopped taking it     HPI Patient was seen today for f/u.  Pt states he is doing well.  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  Pt states bs has been good, 110 at the highest since restarting Metformin 1000 mg BID.  Also on Ozempic 0.5 mg wkly.  Pt notes increased IBS-C symptoms.  Tried OTC meds without improvement.  Using an herbal med which is helping.  In the past was on linzess, but med was expensive.  Endorses bloating, flatus, difficulty losing wt.  Only eating once a day.  Endorses Heartburn.  Taking Nexium which helps.  Past Medical History:  Diagnosis Date   Allergy    seasonal   Asthma    Childhod    Bell's palsy 11/05/2012   Headache    Obesity     Allergies  Allergen Reactions   Bee Venom     ROS General: Denies fever, chills, night sweats +changes in weight, changes in appetite HEENT: Denies headaches, ear pain, changes in vision, rhinorrhea, sore throat CV: Denies CP, palpitations, SOB, orthopnea Pulm: Denies SOB, cough, wheezing GI: Denies abdominal pain, nausea, vomiting, diarrhea +constipation, bloating, flatus, heart burn GU: Denies dysuria, hematuria, frequency, vaginal discharge Msk: Denies muscle cramps, joint pains Neuro: Denies weakness, numbness, tingling Skin: Denies rashes, bruising Psych: Denies depression, anxiety, hallucinations     Objective:    Blood pressure 126/82, pulse 87, temperature 98.6 F (37 C), temperature source Oral, weight 290 lb (131.5 kg), SpO2 97 %.  Gen. Pleasant, well-nourished, in no distress, normal affect   HEENT: [place]/AT, face symmetric, conjunctiva clear, no scleral icterus,  PERRLA, EOMI, nares patent without drainage Lungs: no accessory muscle use, CTAB, no wheezes or rales Cardiovascular: RRR, no m/r/g, no peripheral edema Abdomen: BS present, soft, NT/ND Musculoskeletal: No deformities, no cyanosis or clubbing, normal tone Neuro:  A&Ox3, CN II-XII intact, normal gait Skin:  Warm, no lesions/ rash   Wt Readings from Last 3 Encounters:  07/13/22 290 lb (131.5 kg)  04/19/22 293 lb 9.6 oz (133.2 kg)  03/15/22 287 lb (130.2 kg)    Lab Results  Component Value Date   WBC 9.4 03/07/2022   HGB 14.2 03/07/2022   HCT 42.6 03/07/2022   PLT 295 03/07/2022   GLUCOSE 367 (H) 03/07/2022   CHOL 249 (H) 12/16/2017   TRIG 396 (H) 12/16/2017   HDL 39 (L) 12/16/2017   LDLCALC 149 (H) 12/16/2017   ALT 24 12/16/2017   AST 20 12/16/2017   NA 132 (L) 03/07/2022   K 4.1 03/07/2022   CL 98 03/07/2022   CREATININE 0.67 03/07/2022   BUN 5 (L) 03/07/2022   CO2 23 03/07/2022   HGBA1C 11.9 (A) 04/19/2022   MICROALBUR 2.9 (H) 03/09/2022    Assessment/Plan:  Irritable bowel syndrome with constipation -Low FODMAP diet -We will restart Linzess 145 mcg -For continued worsening symptoms GI referral. - Plan: linaclotide (LINZESS) 145 MCG CAPS capsule  Type 2 diabetes mellitus with other specified complication, without long-term current use of insulin (HCC) -Hgb A1C 11.9% on 04/19/22 -Hemoglobin A1c now 8.3% -We will  increase dose of Ozempic from 0.5 mg weekly to 1 mg weekly. -Continue metformin 1000 mg twice daily -Continue lifestyle modifications  - Plan: POC HgB A1c, Semaglutide, 1 MG/DOSE, 4 MG/3ML SOPN  Gastroesophageal reflux disease, unspecified whether esophagitis present  - Plan: esomeprazole (NEXIUM) 20 MG capsule  F/u in 3-4 months, sooner if needed.  Abbe Amsterdam, MD

## 2022-07-16 ENCOUNTER — Telehealth: Payer: Self-pay

## 2022-07-16 NOTE — Telephone Encounter (Signed)
PA started for Ozempic. Key: [id number].

## 2022-08-06 ENCOUNTER — Encounter: Payer: Self-pay | Admitting: Psychiatry

## 2022-08-06 ENCOUNTER — Ambulatory Visit: Payer: 59 | Admitting: Psychiatry

## 2022-08-06 NOTE — Progress Notes (Deleted)
   CC:  headaches  Follow-up Visit  Last visit: 03/15/22  Brief HPI: 36 year old male with a history of DM2, Left Bell's Palsy (2014), HLD who follows in clinic for chronic headaches.   At his last visit, MRI/CTV were ordered. He was referred to ophthalmology and started on indomethacin trial.  Interval History: Headaches***   MRI brain 03/28/31 was unremarkable. CTV was not done. Ophtho***   Headache days per month: *** Headache free days per month: *** Headache severity: ***  Current Headache Regimen: Preventative: *** Abortive: ***  # of doses of abortive medications per month: ***  Prior Therapies                                  Maxalt 5 mg PRN Lisinopril 2.5 mg daily Excedrin Goody Powders  Physical Exam:   Vital Signs: There were no vitals taken for this visit. GENERAL:  well appearing, in no acute distress, alert  SKIN:  Color, texture, turgor normal. No rashes or lesions HEAD:  Normocephalic/atraumatic. RESP: normal respiratory effort MSK:  No gross joint deformities.   NEUROLOGICAL: Mental Status: Alert, oriented to person, place and time, Follows commands, and Speech fluent and appropriate. Cranial Nerves: PERRL, face symmetric, no dysarthria, hearing grossly intact Motor: moves all extremities equally Gait: normal-based.  IMPRESSION: ***  PLAN: *** Consider Verapamil,prednisone***  Follow-up: ***  I spent a total of *** minutes on the date of the service. Headache education was done. Discussed lifestyle modification including increased oral hydration, decreased caffeine, exercise and stress management. Discussed treatment options including preventive and acute medications, natural supplements, and infusion therapy. Discussed medication overuse headache and to limit use of acute treatments to no more than 2 days/week or 10 days/month. Discussed medication side effects, adverse reactions and drug interactions. Written educational materials and  patient instructions outlining all of the above were given.  Genia Harold, MD

## 2022-08-13 NOTE — Telephone Encounter (Signed)
Your PA has been resolved, no additional PA is required. For further inquiries please contact the number on the back of the member prescription card. (Message [phone])

## 2022-11-09 ENCOUNTER — Ambulatory Visit: Payer: 59 | Admitting: Family Medicine

## 2022-11-12 ENCOUNTER — Ambulatory Visit: Payer: 59 | Admitting: Family Medicine

## 2022-11-23 ENCOUNTER — Ambulatory Visit: Payer: 59 | Admitting: Family Medicine

## 2022-11-23 VITALS — BP 130/88 | HR 86 | Temp 98.7°F | Ht 64.0 in | Wt 290.0 lb

## 2022-11-23 DIAGNOSIS — K581 Irritable bowel syndrome with constipation: Secondary | ICD-10-CM

## 2022-11-23 DIAGNOSIS — J3089 Other allergic rhinitis: Secondary | ICD-10-CM

## 2022-11-23 DIAGNOSIS — I10 Essential (primary) hypertension: Secondary | ICD-10-CM | POA: Diagnosis not present

## 2022-11-23 DIAGNOSIS — E1169 Type 2 diabetes mellitus with other specified complication: Secondary | ICD-10-CM | POA: Diagnosis not present

## 2022-11-23 LAB — POCT GLYCOSYLATED HEMOGLOBIN (HGB A1C): Hemoglobin A1C: 9.5 % — AB (ref 4.0–5.6)

## 2022-11-23 MED ORDER — SEMAGLUTIDE (2 MG/DOSE) 8 MG/3ML ~~LOC~~ SOPN: 2.0000 mg | PEN_INJECTOR | SUBCUTANEOUS | 1 refills | Status: DC

## 2022-11-23 MED ORDER — METFORMIN HCL 1000 MG PO TABS: 1000.0000 mg | ORAL_TABLET | Freq: Two times a day (BID) | ORAL | 3 refills | Status: DC

## 2022-11-23 MED ORDER — LINACLOTIDE 290 MCG PO CAPS: 290.0000 ug | ORAL_CAPSULE | Freq: Every day | ORAL | 5 refills | Status: DC

## 2022-11-23 MED ORDER — FEXOFENADINE HCL 180 MG PO TABS: 180.0000 mg | ORAL_TABLET | Freq: Every day | ORAL | 1 refills | Status: DC

## 2022-11-23 NOTE — Progress Notes (Signed)
Established Patient Office Visit   Subjective  Patient ID: Bruce Henry, male    DOB: [birth year]  Age: 37 y.o. MRN: [id number]  Chief Complaint  Patient presents with   Medical Management of Chronic Issues    Follow up on DM    Patient is a 37 year old male with history of diabetes seen for follow-up.  Patient states he is exercising 4 times per week and cut out eating beef and pork.  Juicing as a meal replacement.  States was unable to obtain metformin from pharmacy.  Taking Ozempic 1 mg weekly.  Hemoglobin A1c was 8.4% on 07/13/2022.  Taking Linzess 145 mcg for IBS-constipation but notes continued symptoms.  Endorses increased rhinorrhea/allergy symptoms.    ROS Negative unless stated above    Objective:     BP 130/88 (BP Location: Left Arm, Patient Position: Sitting, Cuff Size: Large)   Pulse 86   Temp 98.7 F (37.1 C) (Oral)   Ht 5\' 4"  (1.626 m)   Wt 290 lb (131.5 kg)   SpO2 97%   BMI 49.78 kg/m    Physical Exam Constitutional:      General: He is not in acute distress.    Appearance: Normal appearance.  HENT:     Head: Normocephalic and atraumatic.     Right Ear: Tympanic membrane normal.     Left Ear: Tympanic membrane normal.     Nose: Nose normal.     Mouth/Throat:     Mouth: Mucous membranes are moist.  Eyes:     Extraocular Movements: Extraocular movements intact.     Conjunctiva/sclera: Conjunctivae normal.     Pupils: Pupils are equal, round, and reactive to light.  Cardiovascular:     Rate and Rhythm: Normal rate and regular rhythm.     Heart sounds: Normal heart sounds. No murmur heard.    No gallop.  Pulmonary:     Effort: Pulmonary effort is normal. No respiratory distress.     Breath sounds: Normal breath sounds. No wheezing, rhonchi or rales.  Skin:    General: Skin is warm and dry.  Neurological:     Mental Status: He is alert and oriented to person, place, and time. Mental status is at baseline.      Results for orders placed or  performed in visit on 11/23/22  POC HgB A1c  Result Value Ref Range   Hemoglobin A1C 9.5 (A) 4.0 - 5.6 %   HbA1c POC (<> result, manual entry)     HbA1c, POC (prediabetic range)     HbA1c, POC (controlled diabetic range)     Wt Readings from Last 3 Encounters:  11/23/22 290 lb (131.5 kg)  07/13/22 290 lb (131.5 kg)  04/19/22 293 lb 9.6 oz (133.2 kg)      Assessment & Plan:  Type 2 diabetes mellitus with other specified complication, without long-term current use of insulin (HCC) -Hemoglobin A1c 8.4% on 07/13/2022. -Hemoglobin A1c 9.5% this visit -Patient advised increase in A1c possibly 2/2 juicing. -Continue lifestyle modifications -Will send in prescription for metformin 1000 mg twice daily and increase dose of Ozempic from 1 mg to 2 mg weekly. -Patient advised foot exam needed.  Declines this visit. -Continue lisinopril 5 mg daily. -Microalbumin creatinine ratio done 03/09/2022 and normal. -     POCT glycosylated hemoglobin (Hb A1C) -     Semaglutide (2 MG/DOSE); Inject 2 mg as directed once a week.  Dispense: 9 mL; Refill: 1 -     metFORMIN HCl;  Take 1 tablet (1,000 mg total) by mouth 2 (two) times daily with a meal.  Dispense: 180 tablet; Refill: 3  Irritable bowel syndrome with constipation -Discussed low FODMAP diet, consistent water intake and other ways to help decrease constipation symptoms -Consider decreasing juicing to once daily or few times per week as may be causing increased IBS symptoms. -Will increase Linzess from 145 to 290 mcg daily. -Referral placed to GI -     linaCLOtide; Take 1 capsule (290 mcg total) by mouth daily before breakfast.  Dispense: 30 capsule; Refill: 5 -     Ambulatory referral to Gastroenterology  Non-seasonal allergic rhinitis, unspecified trigger -Consider local honey, nasal saline rinse, and or Flonase as needed. -Start Allegra. -     Fexofenadine HCl; Take 1 tablet (180 mg total) by mouth daily.  Dispense: 30 tablet; Refill:  1  Essential hypertension -Elevated at this visit -Continue lisinopril 5 mg daily -Continue lifestyle modifications and monitoring sodium intake. -For continued elevation consistently greater than 140/90 increase lisinopril.  Morbid obesity ([place]) -Body mass index is 49.78 kg/m. -Mild decrease in weight, 3 pound weight loss over the last few months. -Continue lifestyle modifications.  Patient advised to increase protein intake and physical activity. -Ozempic increased to 2 mg weekly  Return in about 5 weeks (around 12/28/2022).   Billie Ruddy, MD

## 2022-12-12 ENCOUNTER — Encounter: Payer: Self-pay | Admitting: Family Medicine

## 2022-12-28 ENCOUNTER — Ambulatory Visit: Payer: 59 | Admitting: Family Medicine

## 2022-12-28 ENCOUNTER — Encounter: Payer: Self-pay | Admitting: Family Medicine

## 2022-12-28 VITALS — BP 112/80 | HR 94 | Temp 98.9°F | Ht 64.0 in | Wt 287.0 lb

## 2022-12-28 DIAGNOSIS — K581 Irritable bowel syndrome with constipation: Secondary | ICD-10-CM | POA: Diagnosis not present

## 2022-12-28 DIAGNOSIS — I10 Essential (primary) hypertension: Secondary | ICD-10-CM

## 2022-12-28 DIAGNOSIS — E1169 Type 2 diabetes mellitus with other specified complication: Secondary | ICD-10-CM | POA: Diagnosis not present

## 2022-12-28 NOTE — Progress Notes (Signed)
Established Patient Office Visit   Subjective  Patient ID: Bruce Henry, male    DOB: 07-10-1986  Age: 37 y.o. MRN: [id number]  Chief Complaint  Patient presents with   Medical Management of Chronic Issues    Pt is a 37 yo male with pmh sig for HTN, DM II, IBS-C, obesity who is seen for f/u.  Pt focused on lifestyle modifications.  Now exercising 3x per wk.  Notes having more energy and feeling better than he has in a while.  Not taking lisinopril 5 mg.  BP at home 1teens/low 80s.  Pt awaiting GI scheduling info.  Referral placed last visit for IBS-C.  Pt started linzess 290 mcg, but has not noticed improvement in symptoms.  Patient is not eating pork or beef.  Trying new fruits such as sour sop, from the local [employer] food market.  BS 90-110.  Highest was 150 due to having carrot cake.  Pt notes weight at home was 276 lbs.  Pt was a 48 waist in pants and now a 40.  Patient has not noticed drastic change in weight from Ozempic use.  Dose was increased to 2 mg on 11/23/2022.      ROS Negative unless stated above    Objective:     BP 112/80 (BP Location: Left Arm, Patient Position: Sitting, Cuff Size: Large)   Pulse 94   Temp 98.9 F (37.2 C) (Oral)   Ht '5\' 4"'$  (1.626 m)   Wt 287 lb (130.2 kg)   SpO2 98%   BMI 49.26 kg/m    Physical Exam Constitutional:      General: He is not in acute distress.    Appearance: Normal appearance.  HENT:     Head: Normocephalic and atraumatic.     Nose: Nose normal.     Mouth/Throat:     Mouth: Mucous membranes are moist.  Cardiovascular:     Rate and Rhythm: Normal rate and regular rhythm.     Heart sounds: Normal heart sounds. No murmur heard.    No gallop.  Pulmonary:     Effort: Pulmonary effort is normal. No respiratory distress.     Breath sounds: Normal breath sounds. No wheezing, rhonchi or rales.  Skin:    General: Skin is warm and dry.  Neurological:     Mental Status: He is alert and oriented to person, place, and time.       No results found for any visits on 12/28/22.    Assessment & Plan:  Type 2 diabetes mellitus with other specified complication, without long-term current use of insulin (HCC) -Hemoglobin A1c 9.5% on 11/23/2022. -BS well-controlled at home since making lifestyle modifications -Continue current medications including 2 mg weekly and metformin 1000 mg twice daily -Continue lifestyle modifications -Foot exam due.  Patient declines at this time -Not currently taking ACE I as bp controlled with diet and exercise. -Patient to schedule diabetic retinopathy screening  Essential hypertension -Controlled -Not currently on medications -Lisinopril 5 mg on med list however patient not taking.  Advised to start for elevation in bp consistently >140/90.  Morbid obesity ([place]) -Body mass index is 49.26 kg/m. -Patient congratulated on efforts -Continue lifestyle modifications -Continue Ozempic 2 mg weekly though not a drastic difference in weight loss.  Irritable bowel syndrome with constipation -ok to d/c linzess 290 mcg as not working. -Awaiting information from referral coordinator regarding patient's GI appointment. -Continue lifestyle modifications   Return in about 4 months (around 04/28/2023).   Larene Beach  Merlene Laughter, MD

## 2023-03-29 ENCOUNTER — Encounter: Payer: Self-pay | Admitting: Family Medicine

## 2023-03-29 ENCOUNTER — Ambulatory Visit (INDEPENDENT_AMBULATORY_CARE_PROVIDER_SITE_OTHER): Payer: 59 | Admitting: Family Medicine

## 2023-03-29 VITALS — BP 138/78 | HR 105 | Temp 99.1°F | Wt 287.6 lb

## 2023-03-29 DIAGNOSIS — Z7985 Long-term (current) use of injectable non-insulin antidiabetic drugs: Secondary | ICD-10-CM

## 2023-03-29 DIAGNOSIS — I10 Essential (primary) hypertension: Secondary | ICD-10-CM

## 2023-03-29 DIAGNOSIS — E1169 Type 2 diabetes mellitus with other specified complication: Secondary | ICD-10-CM

## 2023-03-29 DIAGNOSIS — Z7984 Long term (current) use of oral hypoglycemic drugs: Secondary | ICD-10-CM | POA: Diagnosis not present

## 2023-03-29 DIAGNOSIS — M545 Low back pain, unspecified: Secondary | ICD-10-CM

## 2023-03-29 MED ORDER — LANCET DEVICE MISC: 1.0000 | Freq: Three times a day (TID) | 0 refills | Status: DC

## 2023-03-29 MED ORDER — SEMAGLUTIDE (2 MG/DOSE) 8 MG/3ML ~~LOC~~ SOPN: 2.0000 mg | PEN_INJECTOR | SUBCUTANEOUS | 2 refills | Status: DC

## 2023-03-29 MED ORDER — BLOOD GLUCOSE TEST VI STRP: 1.0000 | ORAL_STRIP | Freq: Three times a day (TID) | 11 refills | Status: DC

## 2023-03-29 MED ORDER — METFORMIN HCL 1000 MG PO TABS: 1000.0000 mg | ORAL_TABLET | Freq: Two times a day (BID) | ORAL | 3 refills | Status: DC

## 2023-03-29 MED ORDER — LANCETS MISC. MISC: 1.0000 | Freq: Three times a day (TID) | 11 refills | Status: DC

## 2023-03-29 MED ORDER — BLOOD GLUCOSE MONITORING SUPPL DEVI: 1.0000 | Freq: Three times a day (TID) | 0 refills | Status: DC

## 2023-03-29 NOTE — Progress Notes (Signed)
Established Patient Office Visit   Subjective  Patient ID: Bruce Henry, male    DOB: 09/15/1986  Age: 37 y.o. MRN: [id number]  Chief Complaint  Patient presents with   Medical Management of Chronic Issues    DM. GI called and left VM     Patient is a 37 year old male with pmh sig for HTN, DM2, obesity who was seen for follow-up.  Patient states he has been doing well overall.  Having difficulty losing weight despite taking Ozempic 2 mg weekly.  Patient requesting refill on Ozempic as out.  Also has been unable to get metformin from pharmacy.  Was advised needed to use mail order pharmacy or [facility] to obtain prescription.  Patient unable to check blood sugar as glucometer stopped working.  Not checking BP at home.  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  Patient endorses left-sided low back pain off-and-on times weeks.  States symptoms improve then returned.  Denies increased heavy lifting, pushing, pulling.  Has not take anything for symptoms.      ROS Negative unless stated above    Objective:     BP 138/78 (BP Location: Right Arm, Patient Position: Sitting, Cuff Size: Large)   Pulse (!) 105   Temp 99.1 F (37.3 C) (Oral)   Wt 287 lb 9.6 oz (130.5 kg)   SpO2 98%   BMI 49.37 kg/m    Physical Exam Constitutional:      General: He is not in acute distress.    Appearance: Normal appearance.  HENT:     Head: Normocephalic and atraumatic.     Nose: Nose normal.     Mouth/Throat:     Mouth: Mucous membranes are moist.  Cardiovascular:     Rate and Rhythm: Normal rate and regular rhythm.     Heart sounds: Normal heart sounds. No murmur heard.    No gallop.  Pulmonary:     Effort: Pulmonary effort is normal. No respiratory distress.     Breath sounds: Normal breath sounds. No wheezing, rhonchi or rales.  Skin:    General: Skin is warm and dry.  Neurological:     Mental Status: He is  alert and oriented to person, place, and time.     Neck size 47 cm No results found for any visits on 03/29/23.    Assessment & Plan:  Type 2 diabetes mellitus with other specified complication, without long-term current use of insulin (HCC) -Hemoglobin A1c 9.5% on 11/23/2022 -Rx for glucometer, metformin, Ozempic resent to pharmacy.  Patient advised to contact clinic if continues to have difficulty obtaining medications. -Patient declines foot exam at this visit -Not currently on ACE I as BP controlled off meds. -     Comprehensive metabolic panel; Future -     Hemoglobin A1c; Future -     Blood Glucose Monitoring Suppl; 1 each by Does not apply route in the morning, at noon, and at bedtime. May substitute to any manufacturer covered by patient's insurance.  Dispense: 1 each; Refill: 0 -     Blood Glucose Test; 1 each by In Vitro route in the morning, at noon, and at bedtime. May substitute to any manufacturer covered by patient's insurance.  Dispense: 100 strip; Refill: 11 -     Lancet Device; 1 each by Does not apply route in the morning, at noon, and at bedtime. May substitute to any manufacturer covered by patient's  insurance.  Dispense: 1 each; Refill: 0 -     Lancets Misc.; 1 each by Does not apply route in the morning, at noon, and at bedtime. May substitute to any manufacturer covered by patient's insurance.  Dispense: 100 each; Refill: 11 -     metFORMIN HCl; Take 1 tablet (1,000 mg total) by mouth 2 (two) times daily with a meal.  Dispense: 180 tablet; Refill: 3 -     Semaglutide (2 MG/DOSE); Inject 2 mg as directed once a week.  Dispense: 9 mL; Refill: 2 -     Microalbumin / creatinine urine ratio; Future  Morbid obesity (HCC) -Body mass index is 49.37 kg/m. -neck size 47 cm.  Will place order for at home sleep study as neck size over the limit for DOT physical -Lifestyle occasions encouraged -     Comprehensive metabolic panel; Future -     TSH; Future -     T4, free;  Future -     VITAMIN D 25 Hydroxy (Vit-D Deficiency, Fractures); Future -     Semaglutide (2 MG/DOSE); Inject 2 mg as directed once a week.  Dispense: 9 mL; Refill: 2 -      Referral to pulmonology  Essential hypertension -controlled -not currently on medication.  Continue to monitor. -For elevation restart low-dose ACE I  Acute left-sided low back pain without sciatica -likely 2/2 muscle strain -Supportive care with heat, massage, stretching, topical analgesics, Tylenol  Return in about 3 months (around 06/29/2023).   Deeann Saint, MD

## 2023-03-30 LAB — COMPREHENSIVE METABOLIC PANEL
AG Ratio: 1.2 (calc) (ref 1.0–2.5)
ALT: 22 U/L (ref 9–46)
AST: 24 U/L (ref 10–40)
Albumin: 3.8 g/dL (ref 3.6–5.1)
Alkaline phosphatase (APISO): 55 U/L (ref 36–130)
BUN: 7 mg/dL (ref 7–25)
CO2: 26 mmol/L (ref 20–32)
Calcium: 9.1 mg/dL (ref 8.6–10.3)
Chloride: 105 mmol/L (ref 98–110)
Creat: 0.75 mg/dL (ref 0.60–1.26)
Globulin: 3.3 g/dL (calc) (ref 1.9–3.7)
Glucose, Bld: 169 mg/dL — ABNORMAL HIGH (ref 65–99)
Potassium: 3.9 mmol/L (ref 3.5–5.3)
Sodium: 139 mmol/L (ref 135–146)
Total Bilirubin: 0.4 mg/dL (ref 0.2–1.2)
Total Protein: 7.1 g/dL (ref 6.1–8.1)

## 2023-03-30 LAB — TSH: TSH: 4.71 mIU/L — ABNORMAL HIGH (ref 0.40–4.50)

## 2023-03-30 LAB — MICROALBUMIN / CREATININE URINE RATIO
Creatinine, Urine: 177 mg/dL (ref 20–320)
Microalb Creat Ratio: 11 mg/g creat (ref <30)
Microalb, Ur: 1.9 mg/dL

## 2023-03-30 LAB — HEMOGLOBIN A1C
Hgb A1c MFr Bld: 7.7 % of total Hgb — ABNORMAL HIGH (ref <5.7)
Mean Plasma Glucose: 174 mg/dL
eAG (mmol/L): 9.7 mmol/L

## 2023-03-30 LAB — VITAMIN D 25 HYDROXY (VIT D DEFICIENCY, FRACTURES): Vit D, 25-Hydroxy: 18 ng/mL — ABNORMAL LOW (ref 30–100)

## 2023-03-30 LAB — T4, FREE: Free T4: 0.8 ng/dL (ref 0.8–1.8)

## 2023-04-12 ENCOUNTER — Other Ambulatory Visit: Payer: Self-pay | Admitting: Family Medicine

## 2023-04-12 DIAGNOSIS — E559 Vitamin D deficiency, unspecified: Secondary | ICD-10-CM | POA: Insufficient documentation

## 2023-04-12 DIAGNOSIS — E038 Other specified hypothyroidism: Secondary | ICD-10-CM

## 2023-04-12 MED ORDER — VITAMIN D (ERGOCALCIFEROL) 1.25 MG (50000 UNIT) PO CAPS
50000.0000 [IU] | ORAL_CAPSULE | ORAL | 0 refills | Status: DC
Start: 2023-04-12 — End: 2023-06-07

## 2023-05-31 ENCOUNTER — Ambulatory Visit: Payer: 59 | Admitting: Family Medicine

## 2023-06-07 ENCOUNTER — Ambulatory Visit: Payer: 59 | Admitting: Family Medicine

## 2023-06-07 ENCOUNTER — Encounter: Payer: Self-pay | Admitting: Primary Care

## 2023-06-07 ENCOUNTER — Encounter: Payer: Self-pay | Admitting: Family Medicine

## 2023-06-07 ENCOUNTER — Ambulatory Visit: Payer: 59 | Admitting: Primary Care

## 2023-06-07 ENCOUNTER — Ambulatory Visit (INDEPENDENT_AMBULATORY_CARE_PROVIDER_SITE_OTHER): Payer: 59 | Admitting: Family Medicine

## 2023-06-07 VITALS — BP 130/88 | HR 89 | Temp 98.8°F | Ht 63.0 in | Wt 288.2 lb

## 2023-06-07 VITALS — BP 118/86 | HR 90 | Temp 99.2°F | Wt 288.4 lb

## 2023-06-07 DIAGNOSIS — E038 Other specified hypothyroidism: Secondary | ICD-10-CM

## 2023-06-07 DIAGNOSIS — Z9189 Other specified personal risk factors, not elsewhere classified: Secondary | ICD-10-CM | POA: Diagnosis not present

## 2023-06-07 DIAGNOSIS — I10 Essential (primary) hypertension: Secondary | ICD-10-CM | POA: Diagnosis not present

## 2023-06-07 DIAGNOSIS — E559 Vitamin D deficiency, unspecified: Secondary | ICD-10-CM

## 2023-06-07 DIAGNOSIS — Z7985 Long-term (current) use of injectable non-insulin antidiabetic drugs: Secondary | ICD-10-CM

## 2023-06-07 DIAGNOSIS — E1169 Type 2 diabetes mellitus with other specified complication: Secondary | ICD-10-CM | POA: Diagnosis not present

## 2023-06-07 LAB — TSH: TSH: 4.79 u[IU]/mL (ref 0.35–5.50)

## 2023-06-07 LAB — T4, FREE: Free T4: 0.56 ng/dL — ABNORMAL LOW (ref 0.60–1.60)

## 2023-06-07 MED ORDER — SEMAGLUTIDE (2 MG/DOSE) 8 MG/3ML ~~LOC~~ SOPN
2.0000 mg | PEN_INJECTOR | SUBCUTANEOUS | 2 refills | Status: DC
Start: 2023-06-07 — End: 2023-10-11

## 2023-06-07 MED ORDER — VITAMIN D (ERGOCALCIFEROL) 1.25 MG (50000 UNIT) PO CAPS
50000.0000 [IU] | ORAL_CAPSULE | ORAL | 0 refills | Status: DC
Start: 2023-06-07 — End: 2024-01-10

## 2023-06-07 NOTE — Assessment & Plan Note (Signed)
-    At risk for sleep apnea due to obesity and neck circumference >17 inches. Not clinically symptomatic. BMI 51. Epworth score 0. Needs HST for DOT exam/license. Reviewed risks of untreated sleep apnea including cardiac arrhythmias, pulmonary hypertension, diabetes and stroke.  Also discussed treatment options including weight loss, oral appliance, CPAP therapy referral to ENT for possible surgical options.  Encourage side sleeping position and weight loss efforts.  Advised against driving if experiencing excessive daytime sleepiness fatigue.  Follow-up 1 to 2 weeks after sleep study review results and treatment options if needed.

## 2023-06-07 NOTE — Patient Instructions (Signed)

## 2023-06-07 NOTE — Progress Notes (Signed)
Established Patient Office Visit   Subjective  Patient ID: Bruce Henry, male    DOB: 05-27-1986  Age: 37 y.o. MRN: [id number]  Chief Complaint  Patient presents with   Medical Management of Chronic Issues    DM and BP    Patient is a 37 year old male seen for follow-up.  Patient states he is doing well overall, better promotion at work.  Patient states he has had several issues with his pharmacy and plans to change to a different one.  Patient states he never started taking metformin as he was unable to get from pharmacy.  Patient states he was only given 4 tabs of ergocalciferol 50,000 IUs and Ozempic was never refilled.  Patient has not had Ozempic since last office visit in May.  Patient states blood sugar at home 90-118 with the highest reading 152 after eating ice cream.  Patient plans on scheduling eye exam.  Patient had initial visit with pulmonology for at home sleep study.  Awaiting med to be mailed.  Patient inquires about subclinical hypothyroidism as noted on labs from 03/29/2023.  Currently asymptomatic.    Past Medical History:  Diagnosis Date   Allergy    seasonal   Asthma    Childhod    Bell's palsy 11/05/2012   Headache    Obesity    Past Surgical History:  Procedure Laterality Date   ANAL FISSURE REPAIR  2007   SHOULDER SURGERY  2003   left   TONSILLECTOMY     Social History   Tobacco Use   Smoking status: Never   Smokeless tobacco: Former    Types: Snuff  Substance Use Topics   Alcohol use: Yes    Alcohol/week: 2.0 standard drinks of alcohol    Types: 2 Glasses of wine per week    Comment: occasionally   Drug use: No   Family History  Problem Relation Age of Onset   Cancer Mother        ovarian   Diabetes Mother    Diabetes Father    Hypertension Father    Diabetes Paternal Grandmother    Hyperlipidemia Paternal Grandmother    Cancer Paternal Grandmother        Brain cancer   Diabetes Brother    Hypertension Brother    Diabetes  Maternal Grandmother    Cancer Maternal Grandmother        Colon cancer   Diabetes Maternal Grandfather    Cancer Maternal Grandfather        pancreatic cancer   Diabetes Paternal Uncle    Allergies  Allergen Reactions   Bee Venom       ROS Negative unless stated above    Objective:     BP 118/86 (BP Location: Left Arm, Patient Position: Sitting, Cuff Size: Large)   Pulse 90   Temp 99.2 F (37.3 C) (Oral)   Wt 288 lb 6.4 oz (130.8 kg)   SpO2 98%   BMI 51.09 kg/m  BP Readings from Last 3 Encounters:  06/07/23 118/86  06/07/23 130/88  03/29/23 138/78   Wt Readings from Last 3 Encounters:  06/07/23 288 lb 6.4 oz (130.8 kg)  06/07/23 288 lb 3.2 oz (130.7 kg)  03/29/23 287 lb 9.6 oz (130.5 kg)      Physical Exam Constitutional:      General: He is not in acute distress.    Appearance: Normal appearance.  HENT:     Head: Normocephalic and atraumatic.     Nose: Nose  normal.     Mouth/Throat:     Mouth: Mucous membranes are moist.  Cardiovascular:     Rate and Rhythm: Normal rate and regular rhythm.     Heart sounds: Normal heart sounds. No murmur heard.    No gallop.  Pulmonary:     Effort: Pulmonary effort is normal. No respiratory distress.     Breath sounds: Normal breath sounds. No wheezing, rhonchi or rales.  Skin:    General: Skin is warm and dry.  Neurological:     Mental Status: He is alert and oriented to person, place, and time.      No results found for any visits on 06/07/23.    Assessment & Plan:  Essential hypertension -Controlled via diet -Continue to monitor.  For consistent elevation restart ACE I  Type 2 diabetes mellitus with other specified complication, without long-term current use of insulin (HCC) -Hemoglobin A1c 7.7% on 03/29/2023 -New Rx for semaglutide sent to pharmacy. -Continue lifestyle modifications -Will discontinue metformin as patient never started medication. -Patient to schedule eye exam -     Semaglutide (2  MG/DOSE); Inject 2 mg as directed once a week.  Dispense: 9 mL; Refill: 2  Vitamin D deficiency -     Vitamin D (Ergocalciferol); Take 1 capsule (50,000 Units total) by mouth every 7 (seven) days.  Dispense: 12 capsule; Refill: 0  Morbid obesity (HCC) -Body mass index is 51.09 kg/m. -Lifestyle modification strongly encouraged -Restart semaglutide -     Semaglutide (2 MG/DOSE); Inject 2 mg as directed once a week.  Dispense: 9 mL; Refill: 2  Subclinical hypothyroidism -     T4, free -     TSH   Return in about 4 months (around 10/07/2023).   Deeann Saint, MD

## 2023-06-07 NOTE — Progress Notes (Signed)
@Patient  ID: Bruce Henry, male    DOB: 09-Jul-1986, 37 y.o.   MRN: [id number]  Chief Complaint  Patient presents with   Consult    Sleep consult.  DOT physical.    Referring provider: Deeann Saint, MD  HPI: 37 year old male, never smoked. PMH significant for diabetes type II, bells palsy, vit d deficiency, ventral hernia, hyperlipidemia, obesity.  06/07/2023 Patient presents today for sleep consult. He is at risk for sleep apnea due to neck size and obesity. 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 He has never fallen asleep while driving. His children have told him that he does not snore. He has never woken up gasping/choking. He feels he sleep wells. He gets about 6 hours of sleep. He is a side sleeper. Typical bed time is 8pm. Sleeps through the night most of the time. He starts his day at 2am. No family hx sleep apnea. No concern for narcolepsy, cataplexy or seizures. Epworth 0.   Sleep questionnaire Symptoms- At risk for sleep apnea due to obesity and neck circumference >17 inches  Prior sleep study- None  Bedtime-8pm  Time to fall asleep- 30-60 mins  Nocturnal awakenings- almost never Out of bed/start of day- 2am Weight changes- NA Do you operate heavy machinery- Yes  Do you currently wear CPAP- No Do you current wear oxygen- No Epworth- 0   Allergies  Allergen Reactions   Bee Venom     Immunization History  Administered Date(s) Administered   DTaP 04/04/1990   HIB (PRP-OMP) 08/28/1988   Hepatitis B 07/17/1996, 08/28/1996, 01/29/1997   IPV 04/04/1990   Influenza,inj,Quad PF,6+ Mos 08/23/2017   Pneumococcal Polysaccharide-23 08/23/2017   Td 01/29/1997   Tdap 02/20/2013    Past Medical History:  Diagnosis Date   Allergy    seasonal   Asthma    Childhod    Bell's palsy 11/05/2012   Headache    Obesity     Tobacco History: Social History   Tobacco Use  Smoking Status Never  Smokeless Tobacco Former   Types: Snuff    Counseling given: Not Answered   Outpatient Medications Prior to Visit  Medication Sig Dispense Refill   Blood Glucose Monitoring Suppl DEVI 1 each by Does not apply route in the morning, at noon, and at bedtime. May substitute to any manufacturer covered by patient's insurance. 1 each 0   diphenhydrAMINE (BENADRYL) 25 MG tablet Take 50 mg by mouth daily as needed for allergies (insect bite).     esomeprazole (NEXIUM) 20 MG capsule Take 1 capsule (20 mg total) by mouth daily at 12 noon. 90 capsule 1   fexofenadine (ALLEGRA) 180 MG tablet Take 1 tablet (180 mg total) by mouth daily. 30 tablet 1   Glucose Blood (BLOOD GLUCOSE TEST STRIPS) STRP Please dispense based on patient and insurance preference. Use to monitor FSBS 2x daily. Dx: E11.9. 100 each 1   Glucose Blood (BLOOD GLUCOSE TEST STRIPS) STRP 1 each by In Vitro route in the morning, at noon, and at bedtime. May substitute to any manufacturer covered by patient's insurance. 100 strip 11   Lancet Device MISC 1 each by Does not apply route in the morning, at noon, and at bedtime. May substitute to any manufacturer covered by patient's insurance. 1 each 0   Vitamin D, Ergocalciferol, (DRISDOL) 1.25 MG (50000 UNIT) CAPS capsule Take 1 capsule (50,000 Units total) by mouth every 7 (seven) days. 12 capsule 0   Lancets Misc. MISC 1  each by Does not apply route in the morning, at noon, and at bedtime. May substitute to any manufacturer covered by patient's insurance. (Patient not taking: Reported on 06/07/2023) 100 each 11   metFORMIN (GLUCOPHAGE) 1000 MG tablet Take 1 tablet (1,000 mg total) by mouth 2 (two) times daily with a meal. (Patient not taking: Reported on 06/07/2023) 180 tablet 3   Semaglutide, 2 MG/DOSE, 8 MG/3ML SOPN Inject 2 mg as directed once a week. (Patient not taking: Reported on 06/07/2023) 9 mL 2   No facility-administered medications prior to visit.   Review of Systems  Review of Systems  Constitutional: Negative.  Negative for  fatigue.  HENT: Negative.    Respiratory: Negative.    Cardiovascular: Negative.   Psychiatric/Behavioral:  Negative for sleep disturbance.    Physical Exam  BP 130/88 (BP Location: Left Arm, Patient Position: Sitting, Cuff Size: Large)   Pulse 89   Temp 98.8 F (37.1 C) (Oral)   Ht 5\' 3"  (1.6 m)   Wt 288 lb 3.2 oz (130.7 kg)   SpO2 99%   BMI 51.05 kg/m  Physical Exam Constitutional:      General: He is not in acute distress.    Appearance: Normal appearance. He is obese. He is not ill-appearing.  HENT:     Head: Normocephalic and atraumatic.     Mouth/Throat:     Mouth: Mucous membranes are moist.     Pharynx: Oropharynx is clear.     Comments: Mallampati class II-III Neck:     Comments: Neck circumference 17 inches  Cardiovascular:     Rate and Rhythm: Normal rate and regular rhythm.  Pulmonary:     Effort: Pulmonary effort is normal.     Breath sounds: Normal breath sounds.  Abdominal:     General: There is distension.  Skin:    General: Skin is warm and dry.  Neurological:     General: No focal deficit present.     Mental Status: He is alert and oriented to person, place, and time. Mental status is at baseline.  Psychiatric:        Mood and Affect: Mood normal.        Behavior: Behavior normal.        Thought Content: Thought content normal.        Judgment: Judgment normal.      Lab Results:  CBC    Component Value Date/Time   WBC 9.4 03/07/2022 2022   RBC 5.09 03/07/2022 2022   HGB 14.2 03/07/2022 2022   HCT 42.6 03/07/2022 2022   PLT 295 03/07/2022 2022   MCV 83.7 03/07/2022 2022   MCH 27.9 03/07/2022 2022   MCHC 33.3 03/07/2022 2022   RDW 13.0 03/07/2022 2022   LYMPHSABS 3.8 03/07/2022 2022   MONOABS 0.7 03/07/2022 2022   EOSABS 0.2 03/07/2022 2022   BASOSABS 0.0 03/07/2022 2022    BMET    Component Value Date/Time   NA 139 03/29/2023 1627   K 3.9 03/29/2023 1627   CL 105 03/29/2023 1627   CO2 26 03/29/2023 1627   GLUCOSE 169 (H)  03/29/2023 1627   BUN 7 03/29/2023 1627   CREATININE 0.75 03/29/2023 1627   CALCIUM 9.1 03/29/2023 1627   GFRNONAA >60 03/07/2022 2022   GFRAA >90 04/29/2013 1706    BNP No results found for: "BNP"  ProBNP No results found for: "PROBNP"  Imaging: No results found.   Assessment & Plan:   At risk for sleep apnea -  At risk for sleep apnea due to obesity and neck circumference >17 inches. Not clinically symptomatic. BMI 51. Epworth score 0. Needs HST for DOT exam/license. Reviewed risks of untreated sleep apnea including cardiac arrhythmias, pulmonary hypertension, diabetes and stroke.  Also discussed treatment options including weight loss, oral appliance, CPAP therapy referral to ENT for possible surgical options.  Encourage side sleeping position and weight loss efforts.  Advised against driving if experiencing excessive daytime sleepiness fatigue.  Follow-up 1 to 2 weeks after sleep study review results and treatment options if needed.     Glenford Bayley, NP 06/07/2023

## 2023-06-14 NOTE — Progress Notes (Signed)
Reviewed and agree with assessment/plan.   Coralyn Helling, MD [facility] Pulmonary/Critical Care 06/14/2023, 3:50 PM Pager:  [phone]

## 2023-10-11 ENCOUNTER — Ambulatory Visit: Payer: 59 | Admitting: Family Medicine

## 2023-10-11 ENCOUNTER — Encounter: Payer: Self-pay | Admitting: Family Medicine

## 2023-10-11 VITALS — BP 124/82 | HR 72 | Temp 99.3°F | Ht 63.0 in | Wt 284.8 lb

## 2023-10-11 DIAGNOSIS — Z7985 Long-term (current) use of injectable non-insulin antidiabetic drugs: Secondary | ICD-10-CM | POA: Diagnosis not present

## 2023-10-11 DIAGNOSIS — Z6841 Body Mass Index (BMI) 40.0 and over, adult: Secondary | ICD-10-CM

## 2023-10-11 DIAGNOSIS — E559 Vitamin D deficiency, unspecified: Secondary | ICD-10-CM

## 2023-10-11 DIAGNOSIS — E1169 Type 2 diabetes mellitus with other specified complication: Secondary | ICD-10-CM | POA: Diagnosis not present

## 2023-10-11 LAB — POCT GLYCOSYLATED HEMOGLOBIN (HGB A1C): Hemoglobin A1C: 9.7 % — AB (ref 4.0–5.6)

## 2023-10-11 MED ORDER — ZEPBOUND 7.5 MG/0.5ML ~~LOC~~ SOAJ: 7.5000 mg | SUBCUTANEOUS | 0 refills | Status: DC

## 2023-10-11 MED ORDER — ZEPBOUND 5 MG/0.5ML ~~LOC~~ SOAJ: 5.0000 mg | SUBCUTANEOUS | 0 refills | Status: DC

## 2023-10-11 MED ORDER — ZEPBOUND 10 MG/0.5ML ~~LOC~~ SOAJ: 10.0000 mg | SUBCUTANEOUS | 1 refills | Status: DC

## 2023-10-11 NOTE — Patient Instructions (Addendum)
Your hemoglobin A1c was 9.7% this visit.  We will stop taking Ozempic and start Zepbound.  A prescription for this was sent to your pharmacy.  It is a weekly medication similar to Ozempic.  You will take 5 mg weekly for 1 month, then increase to 7.5 mg in January 2025, and 10 mg in February 2025 if you are tolerating the medication.  We will have you follow-up in the next 3 months to see how things are going.

## 2023-10-11 NOTE — Progress Notes (Signed)
Established Patient Office Visit   Subjective  Patient ID: Bruce Henry, male    DOB: [birth year]  Age: 37 y.o. MRN: [id number]  Chief Complaint  Patient presents with   Medical Management of Chronic Issues    DM, BG 130(Yeserterday)     Patient is a 37 year old male seen for follow-up on chronic conditions.  Patient states has been doing well overall.  Taking Ozempic 2 mg weekly for DM but has not noticed any weight loss.  Patient states blood sugar 1 teens-130s at home.  Denies any lows or highs.  Patient continuing to exercise regularly and eat healthy.  Patient does not eat beef or pork.  Started eating bison.  Endorses eating more sweets over Thanksgiving holiday.  Has DOT physical today.  Completed 12-week course of vitamin D.     Patient Active Problem List   Diagnosis Date Noted   At risk for sleep apnea 06/07/2023   Vitamin D deficiency 04/12/2023   Ventral hernia 08/09/2017   Constipation 08/09/2017   Hyperlipidemia 01/05/2015   Diabetes mellitus, type II (HCC) 01/05/2015   Morbid obesity (HCC) 01/03/2015   Bell's palsy 05/22/2013   Past Medical History:  Diagnosis Date   Allergy    seasonal   Asthma    Childhod    Bell's palsy 11/05/2012   Headache    Obesity    Past Surgical History:  Procedure Laterality Date   ANAL FISSURE REPAIR  2007   SHOULDER SURGERY  2003   left   TONSILLECTOMY     Social History   Tobacco Use   Smoking status: Never   Smokeless tobacco: Former    Types: Snuff  Substance Use Topics   Alcohol use: Yes    Alcohol/week: 2.0 standard drinks of alcohol    Types: 2 Glasses of wine per week    Comment: occasionally   Drug use: No   Family History  Problem Relation Age of Onset   Cancer Mother        ovarian   Diabetes Mother    Diabetes Father    Hypertension Father    Diabetes Paternal Grandmother    Hyperlipidemia Paternal Grandmother    Cancer Paternal Grandmother        Brain cancer   Diabetes Brother     Hypertension Brother    Diabetes Maternal Grandmother    Cancer Maternal Grandmother        Colon cancer   Diabetes Maternal Grandfather    Cancer Maternal Grandfather        pancreatic cancer   Diabetes Paternal Uncle    Allergies  Allergen Reactions   Bee Venom       ROS Negative unless stated above    Objective:     BP 124/82 (BP Location: Left Arm, Patient Position: Sitting, Cuff Size: Large)   Pulse 72   Temp 99.3 F (37.4 C) (Oral)   Ht 5\' 3"  (1.6 m)   Wt 284 lb 12.8 oz (129.2 kg)   SpO2 94%   BMI 50.45 kg/m  BP Readings from Last 3 Encounters:  10/11/23 124/82  06/07/23 118/86  06/07/23 130/88   Wt Readings from Last 3 Encounters:  10/11/23 284 lb 12.8 oz (129.2 kg)  06/07/23 288 lb 6.4 oz (130.8 kg)  06/07/23 288 lb 3.2 oz (130.7 kg)      Physical Exam Constitutional:      General: He is not in acute distress.    Appearance: Normal appearance.  HENT:  Head: Normocephalic and atraumatic.     Nose: Nose normal.     Mouth/Throat:     Mouth: Mucous membranes are moist.  Cardiovascular:     Rate and Rhythm: Normal rate and regular rhythm.     Heart sounds: Normal heart sounds. No murmur heard.    No gallop.  Pulmonary:     Effort: Pulmonary effort is normal. No respiratory distress.     Breath sounds: Normal breath sounds. No wheezing, rhonchi or rales.  Skin:    General: Skin is warm and dry.  Neurological:     Mental Status: He is alert and oriented to person, place, and time.     Results for orders placed or performed in visit on 10/11/23  POC HgB A1c  Result Value Ref Range   Hemoglobin A1C 9.7 (A) 4.0 - 5.6 %   HbA1c POC (<> result, manual entry)     HbA1c, POC (prediabetic range)     HbA1c, POC (controlled diabetic range)        Assessment & Plan:  Type 2 diabetes mellitus with other specified complication, without long-term current use of insulin (HCC) -Hemoglobin A1c 7.7% on 03/29/2023 -Hemoglobin A1c this visit 9.7% -Will  d/c Ozempic 2 mg weekly. -Start Zepbound. -Patient declined foot exam this visit -Patient advised to schedule diabetic retinopathy screening. -Not currently on statin or ACE I.  Check lipid panel at next [facility] for [facility]. -Follow-up in 3 months, sooner if needed -     POCT glycosylated hemoglobin (Hb A1C) -     Zepbound; Inject 5 mg into the skin once a week.  Dispense: 2 mL; Refill: 0 -     Zepbound; Inject 7.5 mg into the skin once a week.  Dispense: 2 mL; Refill: 0 -     Zepbound; Inject 10 mg into the skin once a week.  Dispense: 6 mL; Refill: 1  Morbid obesity (HCC) -Body mass index is 50.45 kg/m. -Discussed the importance of lifestyle modifications.  Portion size, decreasing carbs and sweets advised. -Stop Ozempic 2 mg weekly -Start Zepbound 5 mg weekly -     Zepbound; Inject 5 mg into the skin once a week.  Dispense: 2 mL; Refill: 0 -     Zepbound; Inject 7.5 mg into the skin once a week.  Dispense: 2 mL; Refill: 0 -     Zepbound; Inject 10 mg into the skin once a week.  Dispense: 6 mL; Refill: 1  Vitamin D deficiency -Completed 12-week course of ergocalciferol.  Will have vitamin D rechecked at patient's convenience.       -      Vitamin D, 25-hydroxy Future  Return in about 3 months (around 01/09/2024).   Deeann Saint, MD

## 2023-11-05 ENCOUNTER — Other Ambulatory Visit (HOSPITAL_COMMUNITY): Payer: Self-pay

## 2023-11-05 ENCOUNTER — Telehealth: Payer: Self-pay | Admitting: Pharmacy Technician

## 2023-11-05 NOTE — Telephone Encounter (Signed)
 Pharmacy Patient Advocate Encounter   Received notification from CoverMyMeds that prior authorization for Zepbound  5MG /0.5ML pen-injectors is required/requested.   Insurance verification completed.   The patient is insured through [facility] .   Per test claim: PA required; PA submitted to above mentioned insurance via CoverMyMeds Key/confirmation #/EOC [id number] Status is pending

## 2023-11-05 NOTE — Telephone Encounter (Signed)
 Pharmacy Patient Advocate Encounter  Received notification from [facility] that Prior Authorization for Zepbound  5MG /0.5ML pen-injectors  has been APPROVED from 11/05/2023 to 06/04/2024. Ran test claim, Copay is $50.00. This test claim was processed through [facility]- copay amounts may vary at other pharmacies due to pharmacy/plan contracts, or as the patient moves through the different stages of their insurance plan.   PA #/Case ID/Reference #: [id number]

## 2023-12-04 IMAGING — CT CT HEAD W/O CM
4 of 5 series · 15 of 47 positions shown, 17 images · non-contrast
Comparison: Head CT dated 02/19/2013.

CLINICAL DATA: Headache.



[Series 3: head without · axial · non-contrast · 0.46mm/px · z∈[-159,-54]mm · 5 of 33 slices shown, 7 images]
[im 6/33  brain]
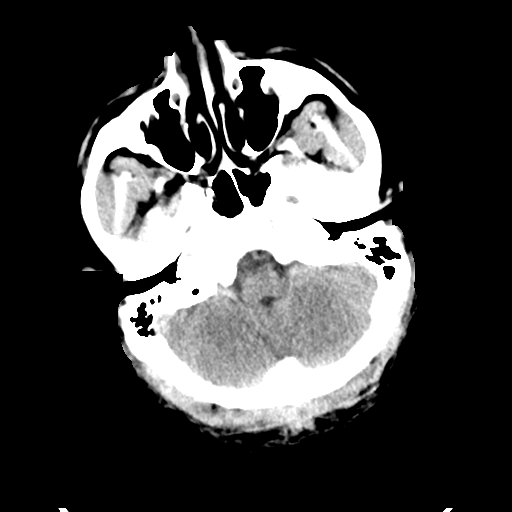
[im 6/33  bone]
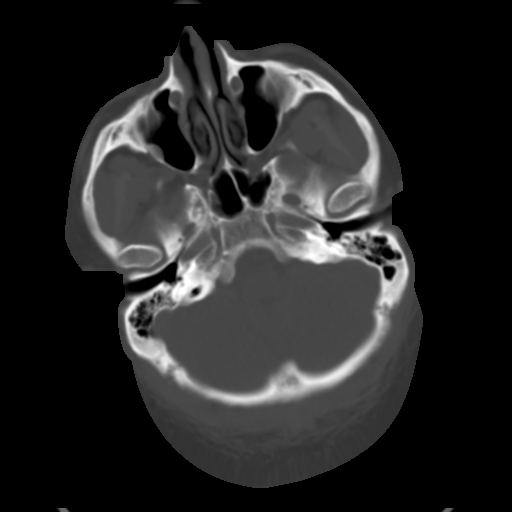
[im 11/33  brain]
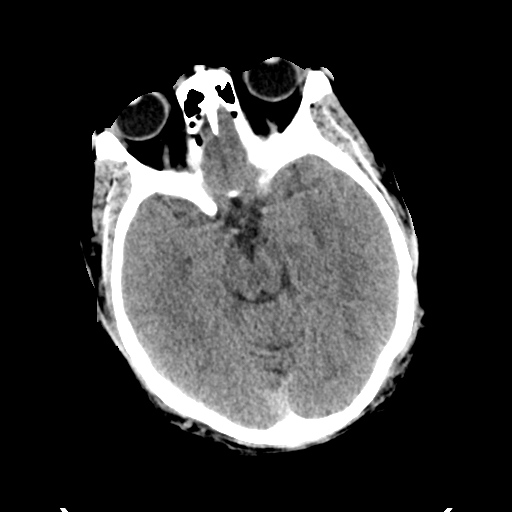
[im 17/33  brain]
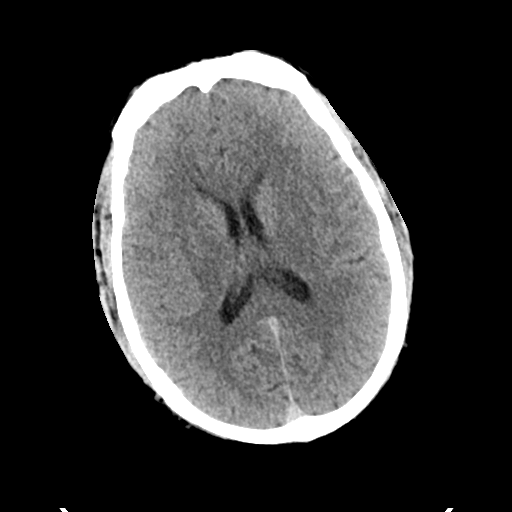
[im 22/33  brain]
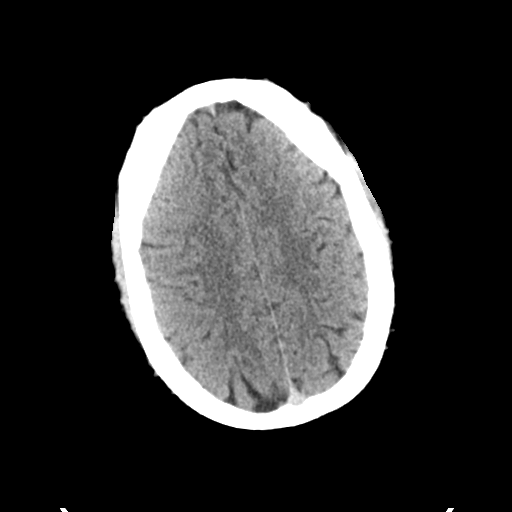
[im 27/33  brain]
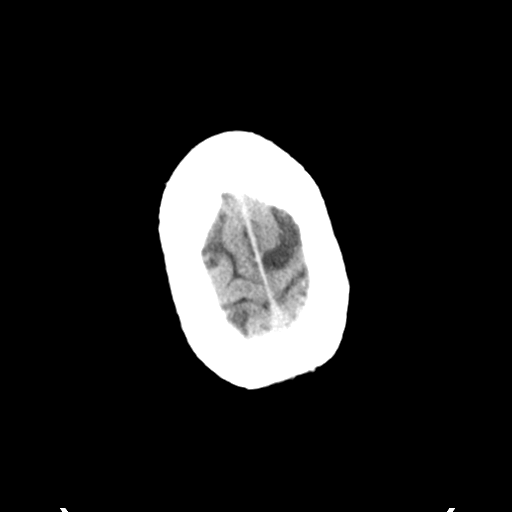
[im 27/33  bone]
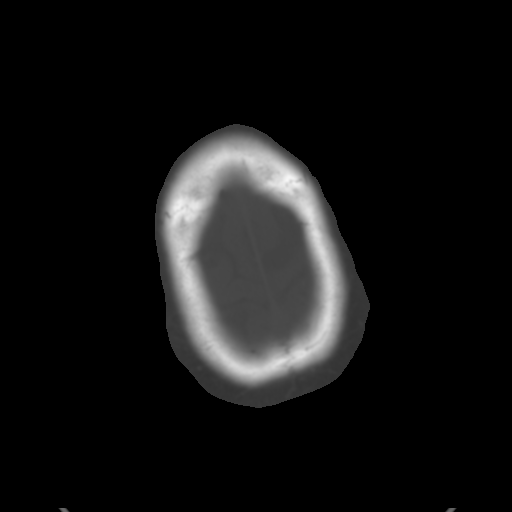

[Series 5: head without cor · coronal · non-contrast · 0.32mm/px · 3 of 75 slices shown]
[im 25/75  brain]
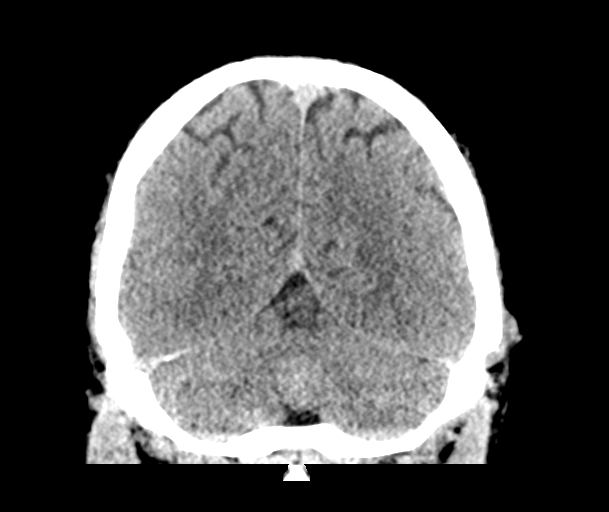
[im 33/75  brain]
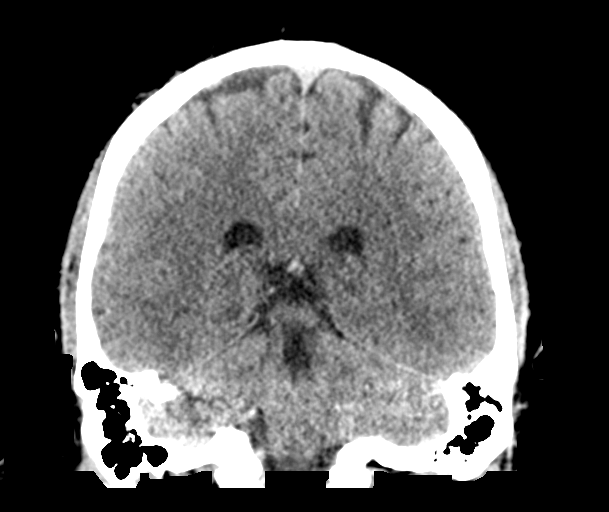
[im 42/75  brain]
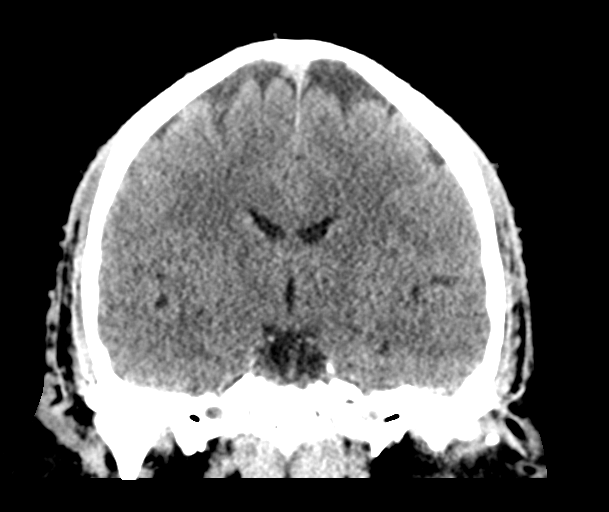

[Series 6: head without sag · sagittal · non-contrast · 0.31mm/px · 3 of 67 slices shown]
[im 23/67  brain]
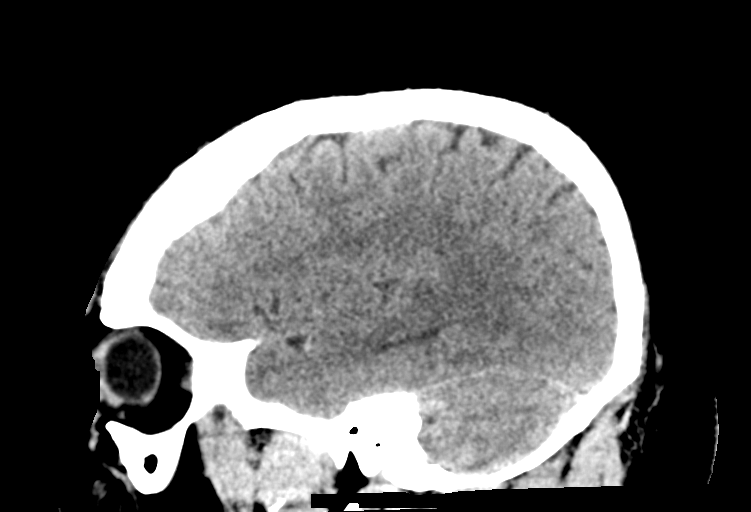
[im 34/67  brain]
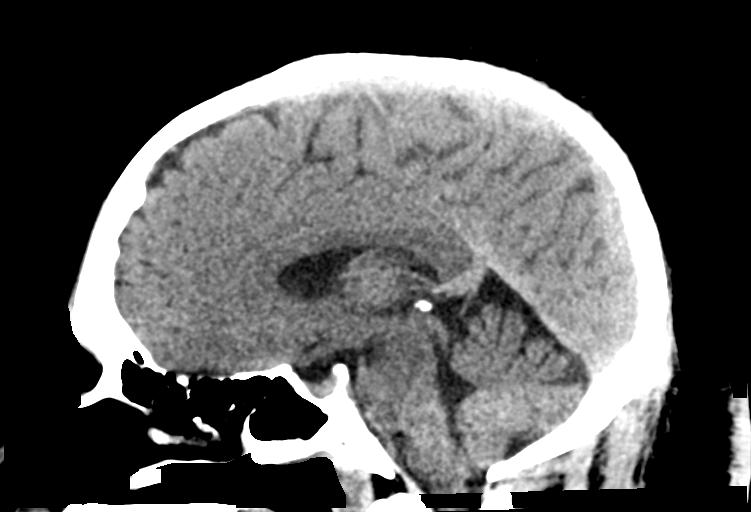
[im 45/67  brain]
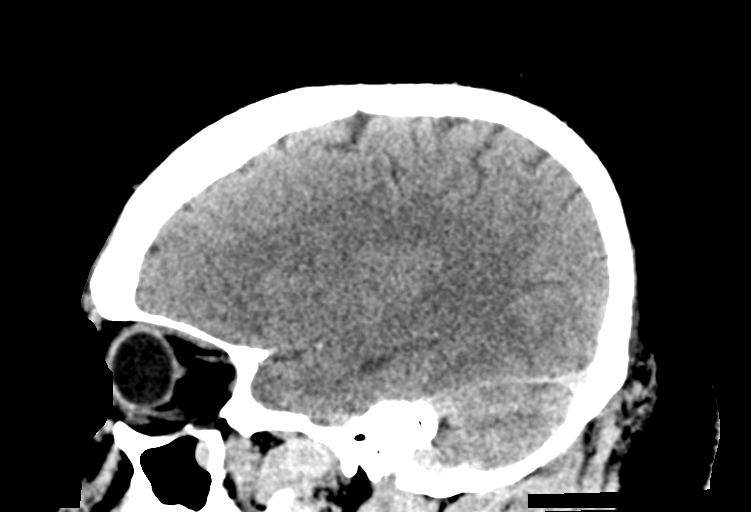

[Series 7: true axial · axial · 0.35mm/px · z∈[-144,-54]mm · 4 of 31 slices shown]
[im 7/31  brain]
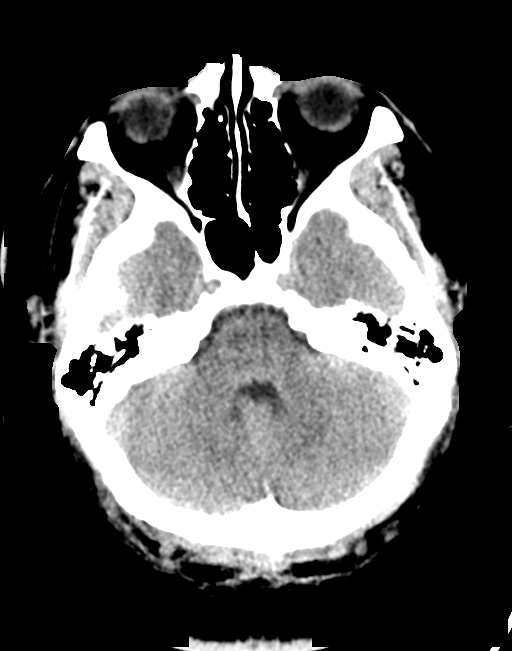
[im 13/31  brain]
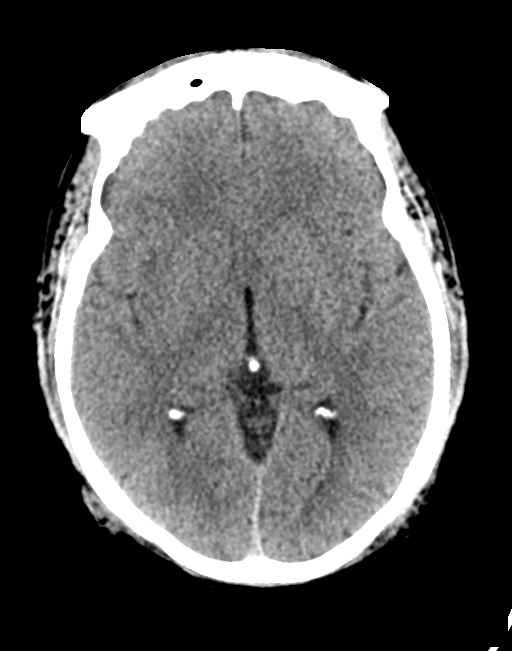
[im 19/31  brain]
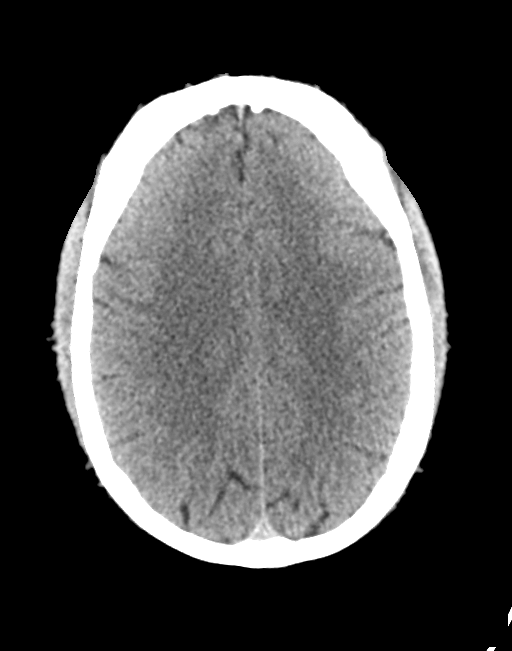
[im 25/31  brain]
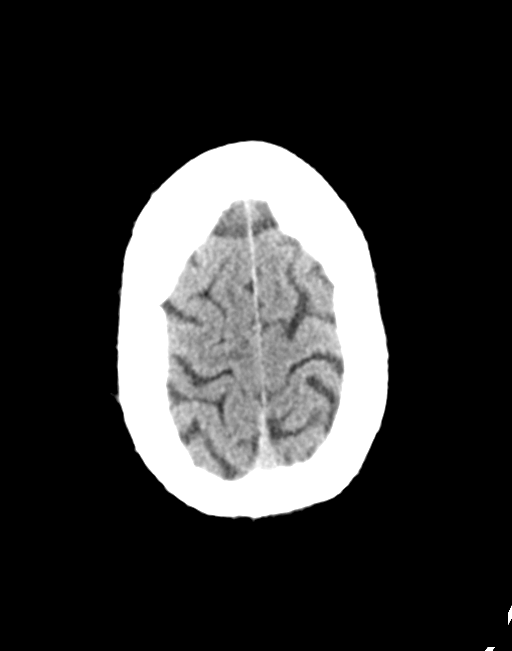

[15 of 47 positions shown; findings below may reference images not displayed]

FINDINGS: Brain: The ventricles and sulci are appropriate size for the
patient's age. The gray-white matter discrimination is preserved.
There is no acute intracranial hemorrhage. No mass effect or midline
shift. No extra-axial fluid collection.

Vascular: No hyperdense vessel or unexpected calcification.

Skull: Normal. Negative for fracture or focal lesion.

Sinuses/Orbits: No acute finding.

Other: None
IMPRESSION: No acute intracranial pathology.

## 2024-01-03 ENCOUNTER — Other Ambulatory Visit: Payer: Self-pay | Admitting: Family Medicine

## 2024-01-03 DIAGNOSIS — E1169 Type 2 diabetes mellitus with other specified complication: Secondary | ICD-10-CM

## 2024-01-10 ENCOUNTER — Ambulatory Visit: Admitting: Family Medicine

## 2024-01-10 ENCOUNTER — Encounter: Payer: Self-pay | Admitting: Family Medicine

## 2024-01-10 ENCOUNTER — Ambulatory Visit: Payer: 59 | Admitting: Family Medicine

## 2024-01-10 VITALS — BP 146/84 | HR 89 | Temp 99.4°F | Ht 63.0 in | Wt 291.6 lb

## 2024-01-10 DIAGNOSIS — E559 Vitamin D deficiency, unspecified: Secondary | ICD-10-CM | POA: Diagnosis not present

## 2024-01-10 DIAGNOSIS — E1169 Type 2 diabetes mellitus with other specified complication: Secondary | ICD-10-CM | POA: Diagnosis not present

## 2024-01-10 DIAGNOSIS — I10 Essential (primary) hypertension: Secondary | ICD-10-CM

## 2024-01-10 LAB — POCT GLYCOSYLATED HEMOGLOBIN (HGB A1C): Hemoglobin A1C: 7.4 % — AB (ref 4.0–5.6)

## 2024-01-10 MED ORDER — ZEPBOUND 7.5 MG/0.5ML ~~LOC~~ SOAJ: 7.5000 mg | SUBCUTANEOUS | 0 refills | Status: DC

## 2024-01-10 MED ORDER — ZEPBOUND 5 MG/0.5ML ~~LOC~~ SOAJ: 5.0000 mg | SUBCUTANEOUS | 0 refills | Status: DC

## 2024-01-10 MED ORDER — ZEPBOUND 10 MG/0.5ML ~~LOC~~ SOAJ: 10.0000 mg | SUBCUTANEOUS | 1 refills | Status: DC

## 2024-01-10 NOTE — Progress Notes (Signed)
 Established Patient Office Visit   Subjective  Patient ID: Bruce Henry, male    DOB: [birth year]  Age: 38 y.o. MRN: [id number]  Chief Complaint  Patient presents with   Medical Management of Chronic Issues    DM, BG running 140's at home in the am     Pt is a 38 yo male seen for f/u on chronic conditions.  BS 141 this am.  RX for zepbound from Dec 2024 just recently approved by insurance.  Took 5 mg wkly dose then unable to get additional refills.  Noticed more energy with Zepbound.  Tried Ozempic, but it did not help with wt loss.  Taking OTC Vit D.   Hasn't been able to exercise like before due to increased responsibilities at work.  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  Still being consistent with diet changes.  BP may be elevated due to  varrying work schedule, now may be up as early as 2 am.    Patient Active Problem List   Diagnosis Date Noted   At risk for sleep apnea 06/07/2023   Vitamin D deficiency 04/12/2023   Ventral hernia 08/09/2017   Constipation 08/09/2017   Hyperlipidemia 01/05/2015   Diabetes mellitus, type II (HCC) 01/05/2015   Morbid obesity (HCC) 01/03/2015   Bell's palsy 05/22/2013   Past Medical History:  Diagnosis Date   Allergy    seasonal   Asthma    Childhod    Bell's palsy 11/05/2012   Headache    Obesity    Past Surgical History:  Procedure Laterality Date   ANAL FISSURE REPAIR  2007   SHOULDER SURGERY  2003   left   TONSILLECTOMY     Social History   Tobacco Use   Smoking status: Never   Smokeless tobacco: Former    Types: Snuff  Substance Use Topics   Alcohol use: Yes    Alcohol/week: 2.0 standard drinks of alcohol    Types: 2 Glasses of wine per week    Comment: occasionally   Drug use: No   Family History  Problem Relation Age of Onset   Cancer Mother        ovarian   Diabetes Mother    Diabetes Father    Hypertension Father    Diabetes Paternal Grandmother    Hyperlipidemia  Paternal Grandmother    Cancer Paternal Grandmother        Brain cancer   Diabetes Brother    Hypertension Brother    Diabetes Maternal Grandmother    Cancer Maternal Grandmother        Colon cancer   Diabetes Maternal Grandfather    Cancer Maternal Grandfather        pancreatic cancer   Diabetes Paternal Uncle    Allergies  Allergen Reactions   Bee Venom       ROS Negative unless stated above    Objective:     BP (!) 146/84 (BP Location: Right Arm, Patient Position: Sitting, Cuff Size: Large)   Pulse 89   Temp 99.4 F (37.4 C) (Oral)   Ht 5\' 3"  (1.6 m)   Wt 291 lb 9.6 oz (132.3 kg)   SpO2 98%   BMI 51.65 kg/m  BP Readings from Last 3 Encounters:  01/10/24 (!) 146/84  10/11/23 124/82  06/07/23 118/86   Wt Readings from Last 3 Encounters:  01/10/24 291 lb 9.6 oz (132.3 kg)  10/11/23 284 lb 12.8 oz (129.2 kg)  06/07/23 288 lb 6.4 oz (130.8 kg)     Physical Exam Constitutional:      General: He is not in acute distress.    Appearance: Normal appearance.  HENT:     Head: Normocephalic and atraumatic.     Nose: Nose normal.     Mouth/Throat:     Mouth: Mucous membranes are moist.  Cardiovascular:     Rate and Rhythm: Normal rate and regular rhythm.     Heart sounds: Normal heart sounds. No murmur heard.    No gallop.  Pulmonary:     Effort: Pulmonary effort is normal. No respiratory distress.     Breath sounds: Normal breath sounds. No wheezing, rhonchi or rales.  Skin:    General: Skin is warm and dry.  Neurological:     Mental Status: He is alert and oriented to person, place, and time.      Results for orders placed or performed in visit on 01/10/24  POC HgB A1c  Result Value Ref Range   Hemoglobin A1C 7.4 (A) 4.0 - 5.6 %   HbA1c POC (<> result, manual entry)     HbA1c, POC (prediabetic range)     HbA1c, POC (controlled diabetic range)        Assessment & Plan:  Type 2 diabetes mellitus with other specified complication, without long-term  current use of insulin (HCC) -     Zepbound; Inject 10 mg into the skin once a week.  Dispense: 6 mL; Refill: 1 -     Zepbound; Inject 7.5 mg into the skin once a week.  Dispense: 2 mL; Refill: 0 -     Zepbound; Inject 5 mg into the skin once a week.  Dispense: 2 mL; Refill: 0 -     POCT glycosylated hemoglobin (Hb A1C)  Essential hypertension  Morbid obesity (HCC) -Body mass index is 51.65 kg/m. -current wt 291.6 lbs  -     Zepbound; Inject 10 mg into the skin once a week.  Dispense: 6 mL; Refill: 1 -     Zepbound; Inject 7.5 mg into the skin once a week.  Dispense: 2 mL; Refill: 0 -     Zepbound; Inject 5 mg into the skin once a week.  Dispense: 2 mL; Refill: 0  Vitamin D deficiency  A1C was 9.7% on 10/11/23.  A1C this visit 7.4%, near controlled.  Control likely to be achieved with restarting zepbound.  New rx's sent to pharmacy.  Lifestyle modifications.  Pt encouraged to schedule eye exam.  Declines foot exam.  Not currently on bp meds.  Recheck.  Monitor at home.  Continue OTC vit d.    Return in about 3 months (around 04/11/2024) for physical, chronic conditions.   Deeann Saint, MD

## 2024-02-01 ENCOUNTER — Other Ambulatory Visit: Payer: Self-pay | Admitting: Family Medicine

## 2024-02-01 DIAGNOSIS — K581 Irritable bowel syndrome with constipation: Secondary | ICD-10-CM

## 2024-02-03 ENCOUNTER — Other Ambulatory Visit: Payer: Self-pay | Admitting: Family Medicine

## 2024-04-17 ENCOUNTER — Ambulatory Visit: Admitting: Family Medicine

## 2024-04-20 ENCOUNTER — Ambulatory Visit: Payer: Self-pay | Admitting: Family Medicine

## 2024-04-27 ENCOUNTER — Ambulatory Visit: Payer: Self-pay | Admitting: Family Medicine

## 2024-04-29 ENCOUNTER — Encounter: Payer: Self-pay | Admitting: Family Medicine

## 2024-04-29 ENCOUNTER — Ambulatory Visit: Payer: Self-pay | Admitting: Family Medicine

## 2024-04-29 VITALS — BP 130/70 | HR 85 | Temp 99.3°F | Ht 63.0 in | Wt 289.0 lb

## 2024-04-29 DIAGNOSIS — G510 Bell's palsy: Secondary | ICD-10-CM

## 2024-04-29 DIAGNOSIS — E1169 Type 2 diabetes mellitus with other specified complication: Secondary | ICD-10-CM | POA: Diagnosis not present

## 2024-04-29 DIAGNOSIS — K581 Irritable bowel syndrome with constipation: Secondary | ICD-10-CM

## 2024-04-29 DIAGNOSIS — E559 Vitamin D deficiency, unspecified: Secondary | ICD-10-CM

## 2024-04-29 DIAGNOSIS — Z7985 Long-term (current) use of injectable non-insulin antidiabetic drugs: Secondary | ICD-10-CM

## 2024-04-29 LAB — BASIC METABOLIC PANEL WITH GFR
BUN: 12 mg/dL (ref 6–23)
CO2: 32 meq/L (ref 19–32)
Calcium: 9.6 mg/dL (ref 8.4–10.5)
Chloride: 99 meq/L (ref 96–112)
Creatinine, Ser: 0.9 mg/dL (ref 0.40–1.50)
GFR: 107.45 mL/min (ref >60.00)
Glucose, Bld: 322 mg/dL — ABNORMAL HIGH (ref 70–99)
Potassium: 4.3 meq/L (ref 3.5–5.1)
Sodium: 135 meq/L (ref 135–145)

## 2024-04-29 LAB — MICROALBUMIN / CREATININE URINE RATIO
Creatinine,U: 64.6 mg/dL
Microalb Creat Ratio: 14.3 mg/g (ref 0.0–30.0)
Microalb, Ur: 0.9 mg/dL (ref 0.0–1.9)

## 2024-04-29 LAB — VITAMIN D 25 HYDROXY (VIT D DEFICIENCY, FRACTURES): VITD: 21.48 ng/mL — ABNORMAL LOW (ref 30.00–100.00)

## 2024-04-29 MED ORDER — ZEPBOUND 7.5 MG/0.5ML ~~LOC~~ SOAJ: 7.5000 mg | SUBCUTANEOUS | 0 refills | Status: DC

## 2024-04-29 MED ORDER — ZEPBOUND 5 MG/0.5ML ~~LOC~~ SOAJ
5.0000 mg | SUBCUTANEOUS | 0 refills | Status: AC
Start: 2024-04-29 — End: ?

## 2024-04-29 MED ORDER — ZEPBOUND 10 MG/0.5ML ~~LOC~~ SOAJ: 10.0000 mg | SUBCUTANEOUS | 1 refills | Status: DC

## 2024-04-29 NOTE — Patient Instructions (Signed)
 A referral was placed for gastroenterology and to the podiatrist.  They will call you about setting up your appointments.  I sent over a new prescription for Zepbound .  You should be able to get this today.

## 2024-04-29 NOTE — Progress Notes (Signed)
 Established Patient Office Visit   Subjective  Patient ID: Bruce Henry, male    DOB: 08-22-1986  Age: 38 y.o. MRN: [id number]  Chief Complaint  Patient presents with   Diabetes    Follow up; doing alright feeling great; but haven't been able to take medication due to insurance. Changed jobs    Patient is a 38 year old male seen for follow-up on DM.  Patient states he has been unable to get Zepbound  due to an change in insurance.  Started on a new job and just received new insurance card this week.  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  Plans to stop by the pharmacy to pick up medication.  Has not been checking blood sugar in a while.  States last time he checked it was 183 3 weeks ago.  Patient plans to set up eye exam next week.  Patient would like new referral to GI for history of IBS-C.     Patient Active Problem List   Diagnosis Date Noted   At risk for sleep apnea 06/07/2023   Vitamin D  deficiency 04/12/2023   Ventral hernia 08/09/2017   Constipation 08/09/2017   Hyperlipidemia 01/05/2015   Diabetes mellitus, type II (HCC) 01/05/2015   Morbid obesity (HCC) 01/03/2015   Bell's palsy 05/22/2013   Past Medical History:  Diagnosis Date   Allergy    seasonal   Asthma    Childhod    Bell's palsy 11/05/2012   Headache    Obesity    Past Surgical History:  Procedure Laterality Date   ANAL FISSURE REPAIR  2007   SHOULDER SURGERY  2003   left   TONSILLECTOMY     Social History   Tobacco Use   Smoking status: Never   Smokeless tobacco: Former    Types: Snuff  Substance Use Topics   Alcohol use: Yes    Alcohol/week: 2.0 standard drinks of alcohol    Types: 2 Glasses of wine per week    Comment: occasionally   Drug use: No   Family History  Problem Relation Age of Onset   Cancer Mother        ovarian   Diabetes Mother    Diabetes Father    Hypertension Father    Diabetes Paternal Grandmother    Hyperlipidemia  Paternal Grandmother    Cancer Paternal Grandmother        Brain cancer   Diabetes Brother    Hypertension Brother    Diabetes Maternal Grandmother    Cancer Maternal Grandmother        Colon cancer   Diabetes Maternal Grandfather    Cancer Maternal Grandfather        pancreatic cancer   Diabetes Paternal Uncle    Allergies  Allergen Reactions   Bee Venom     ROS Negative unless stated above    Objective:     BP 130/70   Pulse 85   Temp 99.3 F (37.4 C)   Ht 5' 3 (1.6 m)   Wt 289 lb (131.1 kg)   SpO2 97%   BMI 51.19 kg/m  BP Readings from Last 3 Encounters:  04/29/24 130/70  01/10/24 (!) 146/84  10/11/23 124/82   Wt Readings from Last 3 Encounters:  04/29/24 289 lb (131.1 kg)  01/10/24 291 lb 9.6 oz (132.3 kg)  10/11/23 284 lb 12.8 oz (129.2 kg)      Physical Exam Constitutional:      General: He is not in  acute distress.    Appearance: Normal appearance.  HENT:     Head: Normocephalic and atraumatic.     Nose: Nose normal.     Mouth/Throat:     Mouth: Mucous membranes are moist.   Cardiovascular:     Rate and Rhythm: Normal rate and regular rhythm.     Heart sounds: Normal heart sounds. No murmur heard.    No gallop.  Pulmonary:     Effort: Pulmonary effort is normal. No respiratory distress.     Breath sounds: Normal breath sounds. No wheezing, rhonchi or rales.   Musculoskeletal:     Comments: L 5th digit of foot everted.  Foot everted b/l.   Skin:    General: Skin is warm and dry.   Neurological:     Mental Status: He is alert and oriented to person, place, and time.        04/29/2024    9:29 AM 01/10/2024    4:29 PM 10/11/2023    8:28 AM  Depression screen PHQ 2/9  Decreased Interest 0 0 0  Down, Depressed, Hopeless 0 0 0  PHQ - 2 Score 0 0 0  Altered sleeping 0 1 0  Tired, decreased energy 0 0 0  Change in appetite 0 0 0  Feeling bad or failure about yourself  0 0 0  Trouble concentrating 0 0 0  Moving slowly or  fidgety/restless 0 0 0  Suicidal thoughts 0 0 0  PHQ-9 Score 0 1 0  Difficult doing work/chores Not difficult at all        04/29/2024    9:30 AM 01/10/2024    4:30 PM 10/11/2023    8:29 AM 06/07/2023    2:23 PM  GAD 7 : Generalized Anxiety Score  Nervous, Anxious, on Edge 0 0 0 0  Control/stop worrying 0 0 0 0  Worry too much - different things 0 0 0 0  Trouble relaxing 0 0 0 0  Restless 0 0 0 0  Easily annoyed or irritable 0 0 0 0  Afraid - awful might happen 0 0 0 0  Total GAD 7 Score 0 0 0 0  Anxiety Difficulty Not difficult at all Not difficult at all     Diabetic Foot Exam - Simple   Simple Foot Form Diabetic Foot exam was performed with the following findings: Yes 04/29/2024 10:03 AM  Visual Inspection No deformities, no ulcerations, no other skin breakdown bilaterally: Yes See comments: Yes Sensation Testing Intact to touch and monofilament testing bilaterally: Yes Pulse Check Posterior Tibialis and Dorsalis pulse intact bilaterally: Yes Comments L 5th digit everted       No results found for any visits on 04/29/24.    Assessment & Plan:   Type 2 diabetes mellitus with other specified complication, without long-term current use of insulin (HCC) -     Microalbumin / creatinine urine ratio -     Basic metabolic panel with GFR; Future -     Ambulatory referral to Podiatry -     Zepbound ; Inject 10 mg into the skin once a week.  Dispense: 6 mL; Refill: 1 -     Zepbound ; Inject 5 mg into the skin once a week.  Dispense: 2 mL; Refill: 0 -     Zepbound ; Inject 7.5 mg into the skin once a week.  Dispense: 2 mL; Refill: 0 -     For Home Use Only DME Diabetic Shoe  Morbid obesity (HCC) -  Zepbound ; Inject 10 mg into the skin once a week.  Dispense: 6 mL; Refill: 1 -     Zepbound ; Inject 5 mg into the skin once a week.  Dispense: 2 mL; Refill: 0 -     Zepbound ; Inject 7.5 mg into the skin once a week.  Dispense: 2 mL; Refill: 0  Irritable bowel syndrome with  constipation -     Ambulatory referral to Gastroenterology  Last hemoglobin A1c 7.4% on 01/14/2024.  Patient advised to restart Zepbound .  New prescription sent to pharmacy.  Also encouraged to start checking blood sugar more frequently.  Continue lifestyle modifications.  Referral to podiatry placed for diabetic shoes and insoles as pt everts foot when walking.    Body mass index is 51.19 kg/m.  New referral for GI placed for history of IBS with constipation.  Previously on Linzess . Return in about 4 months (around 08/29/2024).   Clotilda JONELLE Single, MD

## 2024-05-01 ENCOUNTER — Ambulatory Visit: Payer: Self-pay | Admitting: Family Medicine

## 2024-05-01 ENCOUNTER — Other Ambulatory Visit: Payer: Self-pay | Admitting: Family Medicine

## 2024-05-01 DIAGNOSIS — E559 Vitamin D deficiency, unspecified: Secondary | ICD-10-CM

## 2024-05-01 MED ORDER — VITAMIN D (ERGOCALCIFEROL) 1.25 MG (50000 UNIT) PO CAPS: 50000.0000 [IU] | ORAL_CAPSULE | ORAL | 0 refills | Status: AC

## 2024-06-05 ENCOUNTER — Other Ambulatory Visit (HOSPITAL_COMMUNITY): Payer: Self-pay

## 2024-06-05 ENCOUNTER — Telehealth: Payer: Self-pay

## 2024-06-05 NOTE — Telephone Encounter (Signed)
 Pharmacy Patient Advocate Encounter  Received notification from [employer] that Prior Authorization for ZEPBOUND  has been CANCELLED due to: Weight loss treatments, including but not limited to medications, diet supplements, and surgeries outside the scope of the [facility] program, are not covered.   PA #/Case ID/Reference #: [id number]

## 2024-06-05 NOTE — Telephone Encounter (Signed)
 Pharmacy Patient Advocate Encounter   Received notification from CoverMyMeds that prior authorization for Zepbound  5MG /0.5ML pen-injectors  is due for renewal.   Insurance verification completed.   The patient is insured through [facility].  Action: PA required and submitted KEY/EOC/Request #: [id number] by the processor we sent it to: Resubmitted through CoverMyMeds to [employer]

## 2024-08-21 ENCOUNTER — Telehealth: Payer: Self-pay

## 2024-08-21 ENCOUNTER — Encounter: Payer: Self-pay | Admitting: Family Medicine

## 2024-08-21 ENCOUNTER — Other Ambulatory Visit (HOSPITAL_COMMUNITY): Payer: Self-pay

## 2024-08-21 ENCOUNTER — Ambulatory Visit: Admitting: Family Medicine

## 2024-08-21 VITALS — BP 118/76 | HR 82 | Temp 97.6°F | Ht 64.0 in | Wt 284.4 lb

## 2024-08-21 DIAGNOSIS — Z7985 Long-term (current) use of injectable non-insulin antidiabetic drugs: Secondary | ICD-10-CM | POA: Diagnosis not present

## 2024-08-21 DIAGNOSIS — E1169 Type 2 diabetes mellitus with other specified complication: Secondary | ICD-10-CM | POA: Diagnosis not present

## 2024-08-21 LAB — POCT GLYCOSYLATED HEMOGLOBIN (HGB A1C): Hemoglobin A1C: 12.5 % — AB (ref 4.0–5.6)

## 2024-08-21 MED ORDER — ZEPBOUND 2.5 MG/0.5ML ~~LOC~~ SOAJ: 2.5000 mg | SUBCUTANEOUS | 0 refills | Status: DC

## 2024-08-21 MED ORDER — ZEPBOUND 5 MG/0.5ML ~~LOC~~ SOAJ: 5.0000 mg | SUBCUTANEOUS | 1 refills | Status: DC

## 2024-08-21 NOTE — Progress Notes (Signed)
 Established Patient Office Visit   Subjective  Patient ID: Bruce Henry, male    DOB: Mar 14, 1986  Age: 38 y.o. MRN: [id number]  Chief Complaint  Patient presents with   Follow-up    NOT FASTING 4 month follow up DM     Pt is a 38 yo male seen for f/u on chronic conditions.  Today is pt's birthday.  Pt states he had pizza this am for breakfast.  Eating a bowl of cereal daily.  Tries to eat healthy when working.  Cooks at home.  Previously on Ozempic  but did not see much wt loss.  Wants to restart zepbound , but insurance coverage may be an issue.  Last hgb A1C was 7.4% on 01/10/24.    Patient Active Problem List   Diagnosis Date Noted   At risk for sleep apnea 06/07/2023   Vitamin D  deficiency 04/12/2023   Ventral hernia 08/09/2017   Constipation 08/09/2017   Hyperlipidemia 01/05/2015   Diabetes mellitus, type II (HCC) 01/05/2015   Morbid obesity (HCC) 01/03/2015   Bell's palsy 05/22/2013   Past Medical History:  Diagnosis Date   Allergy    seasonal   Asthma    Childhod    Bell's palsy 11/05/2012   Headache    Obesity    Past Surgical History:  Procedure Laterality Date   ANAL FISSURE REPAIR  2007   SHOULDER SURGERY  2003   left   TONSILLECTOMY     Social History   Tobacco Use   Smoking status: Never   Smokeless tobacco: Former    Types: Snuff  Substance Use Topics   Alcohol use: Yes    Alcohol/week: 2.0 standard drinks of alcohol    Types: 2 Glasses of wine per week    Comment: occasionally   Drug use: No   Family History  Problem Relation Age of Onset   Cancer Mother        ovarian   Diabetes Mother    Diabetes Father    Hypertension Father    Diabetes Paternal Grandmother    Hyperlipidemia Paternal Grandmother    Cancer Paternal Grandmother        Brain cancer   Diabetes Brother    Hypertension Brother    Diabetes Maternal Grandmother    Cancer Maternal Grandmother        Colon cancer   Diabetes Maternal Grandfather    Cancer Maternal  Grandfather        pancreatic cancer   Diabetes Paternal Uncle    Allergies  Allergen Reactions   Bee Venom     ROS Negative unless stated above    Objective:     BP 118/76 (BP Location: Right Arm, Patient Position: Sitting, Cuff Size: Large)   Pulse 82   Temp 97.6 F (36.4 C) (Temporal)   Ht 5' 4 (1.626 m)   Wt 284 lb 6.4 oz (129 kg)   SpO2 98%   BMI 48.82 kg/m  BP Readings from Last 3 Encounters:  08/21/24 118/76  04/29/24 130/70  01/10/24 (!) 146/84   Wt Readings from Last 3 Encounters:  08/21/24 284 lb 6.4 oz (129 kg)  04/29/24 289 lb (131.1 kg)  01/10/24 291 lb 9.6 oz (132.3 kg)      Physical Exam Constitutional:      General: He is not in acute distress.    Appearance: Normal appearance.  HENT:     Head: Normocephalic and atraumatic.     Nose: Nose normal.  Mouth/Throat:     Mouth: Mucous membranes are moist.  Cardiovascular:     Rate and Rhythm: Normal rate and regular rhythm.     Heart sounds: Normal heart sounds. No murmur heard.    No gallop.  Pulmonary:     Effort: Pulmonary effort is normal. No respiratory distress.     Breath sounds: Normal breath sounds. No wheezing, rhonchi or rales.  Skin:    General: Skin is warm and dry.  Neurological:     Mental Status: He is alert and oriented to person, place, and time.        04/29/2024    9:29 AM 01/10/2024    4:29 PM 10/11/2023    8:28 AM  Depression screen PHQ 2/9  Decreased Interest 0 0 0  Down, Depressed, Hopeless 0 0 0  PHQ - 2 Score 0 0 0  Altered sleeping 0 1 0  Tired, decreased energy 0 0 0  Change in appetite 0 0 0  Feeling bad or failure about yourself  0 0 0  Trouble concentrating 0 0 0  Moving slowly or fidgety/restless 0 0 0  Suicidal thoughts 0 0 0  PHQ-9 Score 0  1  0   Difficult doing work/chores Not difficult at all       Data saved with a previous flowsheet row definition      04/29/2024    9:30 AM 01/10/2024    4:30 PM 10/11/2023    8:29 AM 06/07/2023    2:23 PM   GAD 7 : Generalized Anxiety Score  Nervous, Anxious, on Edge 0 0 0 0  Control/stop worrying 0 0 0 0  Worry too much - different things 0 0 0 0  Trouble relaxing 0 0 0 0  Restless 0 0 0 0  Easily annoyed or irritable 0 0 0 0  Afraid - awful might happen 0 0 0 0  Total GAD 7 Score 0 0 0 0  Anxiety Difficulty Not difficult at all Not difficult at all       No results found for any visits on 08/21/24.    Assessment & Plan:   Type 2 diabetes mellitus with other specified complication, without long-term current use of insulin (HCC) -     POCT glycosylated hemoglobin (Hb A1C) -     Zepbound ; Inject 2.5 mg into the skin once a week.  Dispense: 2 mL; Refill: 0  Morbid obesity (HCC) -     Zepbound ; Inject 2.5 mg into the skin once a week.  Dispense: 2 mL; Refill: 0  DM uncontrolled as A1C 7.4% on 01/10/24.  Will see if insurance will cover mounjaro for DM and wt. Body mass index is 48.82 kg/m. Lifestyle modifications .  Foot exam done 04/29/24.   Pt encouraged to schedule eye exam. Not currently on ACE-I, ARB, or statin.  Return in about 3 months (around 11/21/2024) for diabetes.   Clotilda JONELLE Single, MD

## 2024-08-21 NOTE — Telephone Encounter (Signed)
 Pharmacy Patient Advocate Encounter   Received notification from Onbase that prior authorization for Zepbound  2.5 is required/requested.   Insurance verification completed.   The patient is insured through [facility].   Per test claim: Per test claim, medication is not covered due to plan/benefit exclusion, PA not submitted at this time

## 2024-08-21 NOTE — Patient Instructions (Signed)
Happy Birthday!

## 2024-08-25 ENCOUNTER — Other Ambulatory Visit (HOSPITAL_COMMUNITY): Payer: Self-pay

## 2024-08-27 ENCOUNTER — Other Ambulatory Visit (HOSPITAL_COMMUNITY): Payer: Self-pay

## 2024-08-27 ENCOUNTER — Other Ambulatory Visit: Payer: Self-pay | Admitting: Family Medicine

## 2024-08-27 DIAGNOSIS — E1165 Type 2 diabetes mellitus with hyperglycemia: Secondary | ICD-10-CM

## 2024-08-27 MED ORDER — TIRZEPATIDE 2.5 MG/0.5ML ~~LOC~~ SOAJ: 2.5000 mg | SUBCUTANEOUS | 0 refills | Status: DC

## 2024-08-27 MED ORDER — TIRZEPATIDE 5 MG/0.5ML ~~LOC~~ SOAJ: 5.0000 mg | SUBCUTANEOUS | 0 refills | Status: DC

## 2024-08-27 MED ORDER — TIRZEPATIDE 7.5 MG/0.5ML ~~LOC~~ SOAJ: 7.5000 mg | SUBCUTANEOUS | 1 refills | Status: DC

## 2024-08-28 ENCOUNTER — Ambulatory Visit: Admitting: Family Medicine

## 2024-09-03 ENCOUNTER — Telehealth: Payer: Self-pay

## 2024-09-03 ENCOUNTER — Other Ambulatory Visit (HOSPITAL_COMMUNITY): Payer: Self-pay

## 2024-09-03 NOTE — Telephone Encounter (Signed)
 Pharmacy Patient Advocate Encounter   Received notification from Onbase that prior authorization for Mounjaro 2.5MG /0.5ML auto-injectors  is required/requested.   Insurance verification completed.   The patient is insured through [facility].   Per test claim: PA required; PA submitted to above mentioned insurance via Latent Key/confirmation #/EOC [id number] Status is pending

## 2024-09-04 ENCOUNTER — Other Ambulatory Visit (HOSPITAL_COMMUNITY): Payer: Self-pay

## 2024-09-04 NOTE — Telephone Encounter (Signed)
 Pharmacy Patient Advocate Encounter  Received notification from [employer] that Prior Authorization for Mounjaro 2.5MG /0.5ML auto-injectors  has been APPROVED from 09/03/24 to 09/03/25. Unable to obtain price due to refill too soon rejection, last fill date 09/03/24 next available fill date11/20/25   PA #/Case ID/Reference #: [id number]

## 2024-11-20 ENCOUNTER — Ambulatory Visit: Admitting: Family Medicine

## 2024-11-25 ENCOUNTER — Ambulatory Visit: Admitting: Family Medicine

## 2024-11-25 ENCOUNTER — Other Ambulatory Visit (HOSPITAL_BASED_OUTPATIENT_CLINIC_OR_DEPARTMENT_OTHER): Payer: Self-pay

## 2024-11-25 ENCOUNTER — Other Ambulatory Visit: Payer: Self-pay

## 2024-11-25 ENCOUNTER — Encounter: Payer: Self-pay | Admitting: Family Medicine

## 2024-11-25 VITALS — BP 114/80 | HR 88 | Temp 97.9°F | Ht 64.0 in | Wt 282.6 lb

## 2024-11-25 DIAGNOSIS — I10 Essential (primary) hypertension: Secondary | ICD-10-CM | POA: Diagnosis not present

## 2024-11-25 DIAGNOSIS — M6208 Separation of muscle (nontraumatic), other site: Secondary | ICD-10-CM

## 2024-11-25 DIAGNOSIS — K581 Irritable bowel syndrome with constipation: Secondary | ICD-10-CM

## 2024-11-25 DIAGNOSIS — Z7985 Long-term (current) use of injectable non-insulin antidiabetic drugs: Secondary | ICD-10-CM | POA: Diagnosis not present

## 2024-11-25 DIAGNOSIS — E1165 Type 2 diabetes mellitus with hyperglycemia: Secondary | ICD-10-CM

## 2024-11-25 DIAGNOSIS — E559 Vitamin D deficiency, unspecified: Secondary | ICD-10-CM

## 2024-11-25 LAB — POCT GLYCOSYLATED HEMOGLOBIN (HGB A1C): Hemoglobin A1C: 13.6 % — AB (ref 4.0–5.6)

## 2024-11-25 MED ORDER — TIRZEPATIDE 5 MG/0.5ML ~~LOC~~ SOAJ
5.0000 mg | SUBCUTANEOUS | 0 refills | Status: AC
  Filled 2024-11-25: qty 2, 28d supply, fill #0

## 2024-11-25 MED ORDER — BLOOD GLUCOSE TEST VI STRP
1.0000 | ORAL_STRIP | 3 refills | Status: AC
  Filled 2024-11-25: qty 100, 25d supply, fill #0

## 2024-11-25 MED ORDER — BLOOD GLUCOSE MONITOR SYSTEM W/DEVICE KIT
1.0000 | PACK | 0 refills | Status: AC
  Filled 2024-11-25: qty 1, 30d supply, fill #0

## 2024-11-25 MED ORDER — TIRZEPATIDE 7.5 MG/0.5ML ~~LOC~~ SOAJ
7.5000 mg | SUBCUTANEOUS | 1 refills | Status: AC
  Filled 2024-11-25: qty 2, 28d supply, fill #0

## 2024-11-25 MED ORDER — DEXCOM G7 RECEIVER DEVI
1.0000 | 0 refills | Status: AC
  Filled 2024-11-25: qty 1, 30d supply, fill #0

## 2024-11-25 MED ORDER — FREESTYLE LIBRE 3 PLUS SENSOR MISC
3 refills | Status: DC
  Filled 2024-11-25: qty 2, 30d supply, fill #0

## 2024-11-25 MED ORDER — TIRZEPATIDE 2.5 MG/0.5ML ~~LOC~~ SOAJ
2.5000 mg | SUBCUTANEOUS | 0 refills | Status: AC
  Filled 2024-11-25: qty 2, 28d supply, fill #0

## 2024-11-25 MED ORDER — LANCET DEVICE MISC
1.0000 | 0 refills | Status: AC
  Filled 2024-11-25: qty 1, fill #0

## 2024-11-25 MED ORDER — DEXCOM G7 15 DAY SENSOR MISC
1.0000 | 11 refills | Status: AC
  Filled 2024-11-25: qty 2, 30d supply, fill #0

## 2024-11-25 MED ORDER — LANCETS MISC
1.0000 | 3 refills | Status: AC
  Filled 2024-11-25: qty 100, 25d supply, fill #0

## 2024-11-25 NOTE — Progress Notes (Signed)
 "  Established Patient Office Visit   Subjective  Patient ID: Bruce Henry, male    DOB: 1986/03/06  Age: 39 y.o. MRN: [id number]  Chief Complaint  Patient presents with   Medical Management of Chronic Issues    Three month follow-up     Pt is a 39 yo male seen for f/u on chronic conditions.  Was unable to get Mounjaro  due to cost.  Not checking bs, doesn't have glucometer.  Making changes to diet and exercising consistently.  Making shakes with beet root powder for HTN.  BP now improved.  Noticing a bulge in abdomen with sitting up.  Non painful.  Inquires if effecting digestion.  Still having issues with consitpation and IBS.  Requesting new referral to GI.  Previously on linzess .  Needs to have vision checked.    Patient Active Problem List   Diagnosis Date Noted   At risk for sleep apnea 06/07/2023   Vitamin D  deficiency 04/12/2023   Ventral hernia 08/09/2017   Constipation 08/09/2017   Hyperlipidemia 01/05/2015   Diabetes mellitus, type II (HCC) 01/05/2015   Morbid obesity (HCC) 01/03/2015   Bell's palsy 05/22/2013   Past Medical History:  Diagnosis Date   Allergy    seasonal   Asthma    Childhod    Bell's palsy 11/05/2012   Headache    Obesity    Past Surgical History:  Procedure Laterality Date   ANAL FISSURE REPAIR  2007   SHOULDER SURGERY  2003   left   TONSILLECTOMY     Social History[1] Family History  Problem Relation Age of Onset   Cancer Mother        ovarian   Diabetes Mother    Diabetes Father    Hypertension Father    Diabetes Paternal Grandmother    Hyperlipidemia Paternal Grandmother    Cancer Paternal Grandmother        Brain cancer   Diabetes Brother    Hypertension Brother    Diabetes Maternal Grandmother    Cancer Maternal Grandmother        Colon cancer   Diabetes Maternal Grandfather    Cancer Maternal Grandfather        pancreatic cancer   Diabetes Paternal Uncle    Allergies[2]  ROS Negative unless stated above     Objective:     BP 114/80   Pulse 88   Temp 97.9 F (36.6 C)   Ht 5' 4 (1.626 m)   Wt 282 lb 9.6 oz (128.2 kg)   SpO2 97%   BMI 48.51 kg/m  BP Readings from Last 3 Encounters:  11/25/24 114/80  08/21/24 118/76  04/29/24 130/70   Wt Readings from Last 3 Encounters:  11/25/24 282 lb 9.6 oz (128.2 kg)  08/21/24 284 lb 6.4 oz (129 kg)  04/29/24 289 lb (131.1 kg)      Physical Exam Constitutional:      General: He is not in acute distress.    Appearance: Normal appearance.  HENT:     Head: Normocephalic and atraumatic.     Nose: Nose normal.     Mouth/Throat:     Mouth: Mucous membranes are moist.  Cardiovascular:     Rate and Rhythm: Normal rate and regular rhythm.     Heart sounds: Normal heart sounds. No murmur heard.    No gallop.  Pulmonary:     Effort: Pulmonary effort is normal. No respiratory distress.     Breath sounds: Normal breath sounds. No  wheezing, rhonchi or rales.  Skin:    General: Skin is warm and dry.  Neurological:     Mental Status: He is alert and oriented to person, place, and time.        04/29/2024    9:29 AM 01/10/2024    4:29 PM 10/11/2023    8:28 AM  Depression screen PHQ 2/9  Decreased Interest 0 0 0  Down, Depressed, Hopeless 0 0 0  PHQ - 2 Score 0 0 0  Altered sleeping 0 1 0  Tired, decreased energy 0 0 0  Change in appetite 0 0 0  Feeling bad or failure about yourself  0 0 0  Trouble concentrating 0 0 0  Moving slowly or fidgety/restless 0 0 0  Suicidal thoughts 0 0 0  PHQ-9 Score 0  1  0   Difficult doing work/chores Not difficult at all       Data saved with a previous flowsheet row definition      04/29/2024    9:30 AM 01/10/2024    4:30 PM 10/11/2023    8:29 AM 06/07/2023    2:23 PM  GAD 7 : Generalized Anxiety Score  Nervous, Anxious, on Edge 0  0  0  0   Control/stop worrying 0  0  0  0   Worry too much - different things 0  0  0  0   Trouble relaxing 0  0  0  0   Restless 0  0  0  0   Easily annoyed or  irritable 0  0  0  0   Afraid - awful might happen 0  0  0  0   Total GAD 7 Score 0 0 0 0  Anxiety Difficulty Not difficult at all Not difficult at all       Data saved with a previous flowsheet row definition     Results for orders placed or performed in visit on 11/25/24  POC HgB A1c  Result Value Ref Range   Hemoglobin A1C 13.6 (A) 4.0 - 5.6 %   HbA1c POC (<> result, manual entry)     HbA1c, POC (prediabetic range)     HbA1c, POC (controlled diabetic range)        Assessment & Plan:   Uncontrolled type 2 diabetes mellitus with hyperglycemia (HCC) -     POCT glycosylated hemoglobin (Hb A1C) -     Tirzepatide ; Inject 2.5 mg into the skin once a week.  Dispense: 2 mL; Refill: 0 -     Tirzepatide ; Inject 5 mg into the skin once a week.  Dispense: 2 mL; Refill: 0 -     Tirzepatide ; Inject 7.5 mg into the skin once a week.  Dispense: 2 mL; Refill: 1 -     Blood Glucose Monitor System; Use as directed up to 4 times daily.  Dispense: 1 kit; Refill: 0 -     Blood Glucose Test; Use 1 each up to four times daily as directed.  Dispense: 100 strip; Refill: 3 -     Lancet Device; 1 each by Does not apply route as directed. Dispense based on patient and insurance preference. Use up to four times daily as directed. (FOR ICD-10 E10.9, E11.9).  Dispense: 1 each; Refill: 0 -     Lancets; Use 1 each up to four times daily as directed.  Dispense: 100 each; Refill: 3 -     Dexcom G7 15 Day Sensor; 1 Device by Does not  apply route every 14 (fourteen) days.  Dispense: 2 each; Refill: 11 -     Dexcom G7 Receiver; 1 Device by Does not apply route as directed.  Dispense: 1 each; Refill: 0  Morbid obesity (HCC) -     Tirzepatide ; Inject 2.5 mg into the skin once a week.  Dispense: 2 mL; Refill: 0 -     Tirzepatide ; Inject 5 mg into the skin once a week.  Dispense: 2 mL; Refill: 0 -     Tirzepatide ; Inject 7.5 mg into the skin once a week.  Dispense: 2 mL; Refill: 1  Essential hypertension  Rectus  diastasis -     Ambulatory referral to General Surgery  Irritable bowel syndrome with constipation -     Ambulatory referral to Gastroenterology   DM II uncontrolled.  A1C previously 12.5% on 08/21/24.  A1C this visit 13.6%.  continue lifestyle modifications.  Will see if Mounjaro  more cost effective if obtained through: Pharmacy.  Rx for next 3 months sent.  Glucometer and CGM also sent.  Per pharmacy Dexcom preferred.  Patient to schedule eye exam.  Foot exam done 04/29/2024.  GLP-1 also helpful with chronic conditions including morbid obesity, HTN, rectus diastases.  Body mass index is 48.51 kg/m.  Referral to general surgery placed for rectus diastases.  Advised strengthening core as another way to help improve appearance.  Continued IBS with constipation.  Referred to GI.   Return in about 3 months (around 02/23/2025).   Clotilda JONELLE Single, MD     [1]  Social History Tobacco Use   Smoking status: Never   Smokeless tobacco: Former    Types: Snuff  Substance Use Topics   Alcohol use: Yes    Alcohol/week: 2.0 standard drinks of alcohol    Types: 2 Glasses of wine per week    Comment: occasionally   Drug use: No  [2]  Allergies Allergen Reactions   Bee Venom    "

## 2024-11-25 NOTE — Patient Instructions (Signed)
 Hgb A1C 13.6% this visit.

## 2024-12-03 ENCOUNTER — Other Ambulatory Visit (HOSPITAL_BASED_OUTPATIENT_CLINIC_OR_DEPARTMENT_OTHER): Payer: Self-pay

## 2025-03-01 ENCOUNTER — Ambulatory Visit: Admitting: Family Medicine
